# Patient Record
Sex: Female | Born: 1955 | Race: Black or African American | Hispanic: No | Smoking: Former smoker
Health system: Southern US, Community
[De-identification: ages and names within clinical notes are randomized; demographics above are authoritative.]

## PROBLEM LIST (undated history)

## (undated) DIAGNOSIS — T7840XA Allergy, unspecified, initial encounter: Secondary | ICD-10-CM

## (undated) DIAGNOSIS — D649 Anemia, unspecified: Secondary | ICD-10-CM

## (undated) DIAGNOSIS — M8580 Other specified disorders of bone density and structure, unspecified site: Secondary | ICD-10-CM

## (undated) HISTORY — DX: Other specified disorders of bone density and structure, unspecified site: M85.80

## (undated) HISTORY — PX: BREAST BIOPSY: SHX20

## (undated) HISTORY — DX: Allergy, unspecified, initial encounter: T78.40XA

## (undated) HISTORY — PX: ABDOMINAL HYSTERECTOMY: SHX81

## (undated) HISTORY — DX: Anemia, unspecified: D64.9

---

## 1999-10-06 ENCOUNTER — Emergency Department (HOSPITAL_COMMUNITY): Admission: EM | Admit: 1999-10-06 | Discharge: 1999-10-06 | Payer: Self-pay | Admitting: Emergency Medicine

## 1999-10-06 ENCOUNTER — Encounter: Payer: Self-pay | Admitting: Emergency Medicine

## 1999-10-23 ENCOUNTER — Encounter: Payer: Self-pay | Admitting: Family Medicine

## 1999-10-23 ENCOUNTER — Encounter: Admission: RE | Admit: 1999-10-23 | Discharge: 1999-10-23 | Payer: Self-pay | Admitting: Family Medicine

## 2000-01-11 ENCOUNTER — Encounter: Payer: Self-pay | Admitting: Family Medicine

## 2000-01-11 ENCOUNTER — Encounter: Admission: RE | Admit: 2000-01-11 | Discharge: 2000-01-11 | Payer: Self-pay | Admitting: Family Medicine

## 2000-06-28 ENCOUNTER — Other Ambulatory Visit: Admission: RE | Admit: 2000-06-28 | Discharge: 2000-06-28 | Payer: Self-pay | Admitting: *Deleted

## 2000-07-17 ENCOUNTER — Ambulatory Visit (HOSPITAL_COMMUNITY): Admission: RE | Admit: 2000-07-17 | Discharge: 2000-07-17 | Payer: Self-pay | Admitting: Family Medicine

## 2000-07-17 ENCOUNTER — Encounter: Payer: Self-pay | Admitting: Family Medicine

## 2000-10-29 ENCOUNTER — Other Ambulatory Visit: Admission: RE | Admit: 2000-10-29 | Discharge: 2000-10-29 | Payer: Self-pay | Admitting: Gynecology

## 2001-01-14 ENCOUNTER — Encounter: Admission: RE | Admit: 2001-01-14 | Discharge: 2001-01-14 | Payer: Self-pay | Admitting: Gynecology

## 2001-01-14 ENCOUNTER — Encounter: Payer: Self-pay | Admitting: Gynecology

## 2001-01-22 ENCOUNTER — Encounter (INDEPENDENT_AMBULATORY_CARE_PROVIDER_SITE_OTHER): Payer: Self-pay | Admitting: Specialist

## 2001-01-22 ENCOUNTER — Ambulatory Visit (HOSPITAL_COMMUNITY): Admission: RE | Admit: 2001-01-22 | Discharge: 2001-01-22 | Payer: Self-pay | Admitting: Gynecology

## 2001-05-17 ENCOUNTER — Inpatient Hospital Stay (HOSPITAL_COMMUNITY): Admission: AD | Admit: 2001-05-17 | Discharge: 2001-05-17 | Payer: Self-pay | Admitting: *Deleted

## 2001-07-28 ENCOUNTER — Inpatient Hospital Stay (HOSPITAL_COMMUNITY): Admission: RE | Admit: 2001-07-28 | Discharge: 2001-07-30 | Payer: Self-pay | Admitting: Gynecology

## 2001-07-28 ENCOUNTER — Encounter (INDEPENDENT_AMBULATORY_CARE_PROVIDER_SITE_OTHER): Payer: Self-pay | Admitting: Specialist

## 2001-09-08 ENCOUNTER — Inpatient Hospital Stay (HOSPITAL_COMMUNITY): Admission: AD | Admit: 2001-09-08 | Discharge: 2001-09-08 | Payer: Self-pay | Admitting: *Deleted

## 2002-01-16 ENCOUNTER — Encounter: Payer: Self-pay | Admitting: Gynecology

## 2002-01-16 ENCOUNTER — Encounter: Admission: RE | Admit: 2002-01-16 | Discharge: 2002-01-16 | Payer: Self-pay | Admitting: Gynecology

## 2003-03-11 ENCOUNTER — Encounter: Admission: RE | Admit: 2003-03-11 | Discharge: 2003-03-11 | Payer: Self-pay | Admitting: Gynecology

## 2003-03-11 ENCOUNTER — Encounter: Payer: Self-pay | Admitting: Gynecology

## 2004-03-23 ENCOUNTER — Encounter: Admission: RE | Admit: 2004-03-23 | Discharge: 2004-03-23 | Payer: Self-pay | Admitting: Gynecology

## 2005-03-30 ENCOUNTER — Encounter: Admission: RE | Admit: 2005-03-30 | Discharge: 2005-03-30 | Payer: Self-pay | Admitting: Internal Medicine

## 2005-04-17 ENCOUNTER — Encounter: Admission: RE | Admit: 2005-04-17 | Discharge: 2005-04-17 | Payer: Self-pay | Admitting: Internal Medicine

## 2006-04-23 ENCOUNTER — Encounter: Admission: RE | Admit: 2006-04-23 | Discharge: 2006-04-23 | Payer: Self-pay | Admitting: Internal Medicine

## 2006-11-13 ENCOUNTER — Other Ambulatory Visit: Admission: RE | Admit: 2006-11-13 | Discharge: 2006-11-13 | Payer: Self-pay | Admitting: Gynecology

## 2006-12-20 ENCOUNTER — Encounter: Admission: RE | Admit: 2006-12-20 | Discharge: 2006-12-20 | Payer: Self-pay | Admitting: Internal Medicine

## 2007-06-06 ENCOUNTER — Ambulatory Visit: Payer: Self-pay

## 2010-05-02 ENCOUNTER — Encounter: Admission: RE | Admit: 2010-05-02 | Discharge: 2010-05-02 | Payer: Self-pay | Admitting: Internal Medicine

## 2010-12-29 NOTE — Op Note (Signed)
[facility]  Patient:    Claudia Adkins, Claudia Adkins                        MRN: [id number] Proc. Date: 01/22/01 Adm. Date:  [id number] Attending:  Merrily Pew                           Operative Report  PREOPERATIVE DIAGNOSIS:       Leiomyomata.  POSTOPERATIVE DIAGNOSIS:      Leiomyomata.  PROCEDURE:                    Hysteroscopic myomectomy.  SURGEON:                      Timothy P. Fontaine, M.D.  ANESTHESIA:                   General.  ESTIMATED BLOOD LOSS:         Minimal.  COMPLICATIONS:                None.  SORBITOL DISCREPANCY:         650 cc.  Machine report noting copious amounts on the floor.  SPECIMENS:                    1. Myoma fragments.                               2. Endometrial curettings.  FINDINGS:                     EUA showed the uterus to be 12-week sized. Adnexa without gross masses.  Hysteroscopic evaluation showed two submucous myomas, one in the right lateral lower segment and one in the left posterior upper fundal region.  Hysteroscopy was otherwise normal.  The left tubal ostium was visualized.  The right tubal ostium was not visualized due to distortion from the myoma, but the periostial region was normal.  The lower uterine segment and endocervical canal were all normal.  DESCRIPTION OF PROCEDURE:     The patient was taken to the operating room and underwent general anesthesia.  She was placed in the low dorsal lithotomy position.  She received a vaginal perineal preparation with Betadine solution. The bladder was emptied with in-and-out Foley catheterization.  EUA was performed.  The patient was then draped in the usual fashion.  The cervix was visualized and grasped with a single-tooth tenaculum and gently dilated to admit the hysteroscopic resectoscope.  Hysteroscopy was then performed with findings noted above.  Both submucous myomas were removed in their entirety utilizing the right angle loop on  multiple passes, noting when the cavity was decompressed more of the myomas ballooned into the cavity, allowing for complete removal of both.  A sharp curettage was subsequently performed and, on repeat hysteroscopy, there was good distention and no evidence of perforation.  Several bleeding areas were addressed with the coagulation setting, achieving adequate hemostasis.  The instruments were then removed. The cervix was visualized.  Adequate hemostasis was visualized.  The speculum was removed and the patient placed in the supine position, awakened without difficulty and taken to the recovery room in good condition, having tolerated the procedure well.  The machine reported Sorbitol discrepancy was 650 cc, although there was a moderate amount of spillage on  the floor, making it much less than the recorded value. DD:  01/22/01 TD:  01/22/01 Job: [id number]

## 2010-12-29 NOTE — H&P (Signed)
[facility]  Patient:    Claudia Adkins, Claudia Adkins                        MRN: [id number] Adm. Date:  01/22/01 Attending:  Marcial Pacas P. Fontaine, M.D.                         History and Physical  CHIEF COMPLAINT:              Increasing dysmenorrhea and metrorrhagia.  HISTORY OF PRESENT ILLNESS:   A 55 year old G2, P2 female status post tubal sterilization who presents with a history of increasing dysmenorrhea and metrorrhagia.  The patient notes she will change tampons up to every half hour during the first two days of her cycle and her overall cycle length lasts five days.  She is otherwise having regular menses with no significant dyspareunia or other abnormalities.  Her uterus is slightly enlarged on exam, measuring roughly 10-12 weeks, and an ultrasound with sonohysterogram was performed. The sonohysterogram does confirm numerous myomas, the largest measuring 48 mm in diameter with the sonohysterogram showing an anterior submucous myoma measuring 33 mm.  The patient is admitted at this time for hysteroscopy submucous myomectomy.  PAST MEDICAL HISTORY:         Uncomplicated.  PAST SURGICAL HISTORY:        Tubal sterilization and cesarean section.  ALLERGIES:                    SULFA.  REVIEW OF SYSTEMS:            Noncontributory.  SOCIAL HISTORY:               No cigarette or alcohol use.  FAMILY HISTORY:               Noncontributory.  PHYSICAL EXAMINATION:  VITAL SIGNS:                  Afebrile.  Vital signs are stable.  HEENT:                        Normal.  LUNGS:                        Clear.  CARDIAC:                      Regular rate without rubs, murmurs, or gallops.  ABDOMEN:                      Benign.  PELVIC:                       External BUS, vagina normal.  Cervix overall normal.  Uterus 10-12 week size.  Adnexa without gross masses or tenderness.  ASSESSMENT:                   A 55 year old G2, P2 female status post  tubal sterilization with increasing dysmenorrhea and metrorrhagia, anemic on hemoglobin check with a hemoglobin of 9.3 in the office.  Sonohysterogram showing multiple myomas with a submucous component, as well as a questionable endometrial polyp.  The patient is admitted for hysteroscopic evaluation and submucous myomectomy and polypectomy if indeed is confirmed.  The expected intra-operative/postoperative courses of the procedure were discussed with the patient to include the use  of the hysteroscope, use of a resectoscope, and electrosurgical energy.  The risks of infection, prolonged antibiotics, hemorrhage necessitating transfusion, and the risks of transfusion were all discussed with her.  The risks of uterine perforation, damage to internal organs including bowel, bladder, ureters, vessels, and nerves necessitating major exploratory reparative surgeries and future reparative surgeries including ostomy formation was all discussed with her.  She understands that is she does have multiple myomas that we will not address all of her leiomyomata but only the submucous components.  Hopefully, that removing this will lighten her periods, although she understands there are no guarantees and that her periods may continue to be persistently heavy and painful in the future.  She agrees with this conservative approach, although understands that if indeed her periods continue to be heavy and unacceptably painful that more aggressive therapies may be offered in the future.  The patients questions were all answered to her satisfaction and she is ready to proceed with surgery. DD:  01/20/01 TD:  01/20/01 Job: [id number]

## 2010-12-29 NOTE — Discharge Summary (Signed)
[facility]  Patient:    Claudia Adkins, Claudia Adkins Visit Number: [id number] MRN: [id number]          Service Type: GYN Location: 9300 [id number] Attending Physician:  Merrily Pew Dictated by:   Antony Contras, N.P. Admit Date:  07/28/2001 Discharge Date: 07/30/2001                             Discharge Summary  DISCHARGE DIAGNOSES:          Leiomyomata, suspected endometriosis.  PROCEDURE:                    Total abdominal hysterectomy.  HISTORY OF PRESENT ILLNESS:   Patient is a 55 year old gravida 2, para 2 status post tubal sterilization with a history of increasing dysmenorrhea and menorrhagia.  She has known leiomyomata.  Underwent a hysteroscopic myomectomy in June 2000.  Her symptoms have persisted and she is admitted now for a TAH.  HOSPITAL COURSE:              Patient was admitted on July 28, 2001. Total abdominal hysterectomy was performed by Dr. Colin Broach, assisted by Dr. Douglass Rivers under general endotracheal anesthesia.  Findings included large uterus with multiple leiomyomata, obliterated cul-de-sac to the level of the lower uterine segment posteriorly.  No active endometriosis seen.  Right and left ovaries grossly normal.  Left ovary adherent to the left ______ uterus which was free.  Fallopian tubes segments bilaterally within normal limits.  Anterior cul-de-sac with evidence of prior cesarean section, scarring of midline uterine anterior surface.  Postoperatively patient remained afebrile.  She did have some orthostasis postoperatively, but did have a hemoglobin of 8.9 with hematocrit 26.3, platelets 173,000.  She was able to be discharged on her second postoperative day in satisfactory condition.  DISPOSITION:                  Follow up in the office in two weeks.  She was given 20 of Tylox. Dictated by:   Antony Contras, N.P. Attending Physician:  Merrily Pew DD:  08/15/01 TD:  08/16/01 Job:  [id number]

## 2010-12-29 NOTE — H&P (Signed)
[facility]  Patient:    Claudia Adkins, Claudia Adkins Visit Number: [id number] MRN: [id number]          Service Type: GYN Location: [facility] Attending Physician:  Wetzel Bjornstad Dictated by:   Nadyne Coombes Fontaine, M.D. Admit Date:  05/17/2001 Discharge Date: 05/17/2001                           History and Physical  CHIEF COMPLAINT:              Increased dysmenorrhea, menorrhagia, leiomyomata.  HISTORY OF PRESENT ILLNESS:   A 55 year old G2 P2 female status post tubal sterilization with a history of increasing dysmenorrhea and menorrhagia.  The patient has known leiomyomata and underwent a hysteroscopic myomectomy in June 2002 to remove intercavitary myomas.  The patients symptoms have persisted and she is admitted at this time for TAH.  PAST MEDICAL HISTORY:         Uncomplicated.  PAST SURGICAL HISTORY:        Tubal sterilization, cesarean section x 2.  ALLERGIES:                    SULFA.  REVIEW OF SYSTEMS:            Noncontributory.  SOCIAL HISTORY:               No cigarette or alcohol use.  FAMILY HISTORY:               Noncontributory.  PHYSICAL EXAMINATION:  VITAL SIGNS:                  Afebrile; vital signs are stable.  HEENT:                        Normal.  LUNGS:                        Clear.  CARDIAC:                      Regular rate without rubs, murmurs, or gallops.  ABDOMEN:                      Benign.  PELVIC:                       External, BUS, vagina normal.  Cervix grossly normal.  Uterus bulky, relatively fixed.  Adnexa without masses or tenderness.  ASSESSMENT:                   A 55 year old G2 P2 female status post tubal sterilization, approximately 14-week size uterus with multiple myomas, increasing menorrhagia, dysmenorrhea, for total abdominal hysterectomy. Alternatives for the procedure have been reviewed with the patient and she elects for a total abdominal hysterectomy.  The patients history is significant  for anemia due to her bleeding, noting a hemoglobin of 9.0 in June.  She subsequently has been on Lupron Depot for menstrual suppression and her most recent hemoglobin is 11.8.  I reviewed with the patient the expected intraoperative/postoperative courses and her recovery period.  We initially evaluated for the potential for an LAVH but due to the relative fixed nature and bulkiness of the uterus, we felt it most prudent to proceed with a total abdominal hysterectomy.  The ovarian conservation issue was reviewed with her and the  options for removing her ovaries or keeping them were reviewed, and the issues of continued hormonal production versus the risks of ovarian cancer and the problems with benign ovarian disease in the future requiring reoperation was all reviewed with her.  The patient desires to keep one or both ovaries and she accepts the risks of ovarian cancer and reoperation for benign ovarian disease, although she does give me permission to remove one or both ovaries if significant disease is encountered at the time of surgery. The risks of inadvertent injury to internal organs during the procedure, including bowel, bladder, ureters, vessels, and nerves necessitating major exploratory reparative surgeries and future reparative surgeries including ostomy formation were discussed with her.  The risks of transfusion, particularly noting her history of a low hemoglobin, were reviewed, and the risks of transfusion reaction, hepatitis, HIV, mad cow disease, and other unknown entities were all reviewed, understood, and accepted.  The risks of infection both internal requiring prolonged antibiotics, abscess formation requiring opening and draining of abscesses, as well as wound complications requiring opening and draining of incisions and closure by secondary intention was all discussed with her.  The absolute sterility associated with hysterectomy was reviewed with her.  The sexuality  following hysterectomy was also reviewed and the potential for orgasmic dysfunction as well as persistent dyspareunia was all discussed, understood, and accepted.  The patients questions were answered to her satisfaction and she is ready to proceed with surgery. Dictated by:   Nadyne Coombes. Fontaine, M.D. Attending Physician:  Wetzel Bjornstad DD:  07/25/01 TD:  07/25/01 Job: [id number]

## 2010-12-29 NOTE — Op Note (Signed)
[facility]  Patient:    Claudia Adkins, Claudia Adkins Visit Number: [id number] MRN: [id number]          Service Type: GYN Location: 9300 [id number] Attending Physician:  Merrily Pew Dictated by:   Nadyne Coombes Fontaine, M.D. Proc. Date: 07/28/01 Admit Date:  07/28/2001                             Operative Report  PREOPERATIVE DIAGNOSIS:       Leiomyomata.  POSTOPERATIVE DIAGNOSIS:      Leiomyomata, endometriosis suspected.  OPERATION:                    Total abdominal hysterectomy.  SURGEON:                      Timothy P. Fontaine, M.D.  ASSISTANT:                    Douglass Rivers, M.D.  ANESTHESIA:                   General endotracheal anesthesia.  ESTIMATED BLOOD LOSS:         750 cc.  COMPLICATIONS:                None.  SPECIMEN:                     Uterus.  FINDINGS:  Enlarged uterus with multiple leiomyomata, obliterated cul-de-sac to the level of the lower uterine segment posteriorly.  No active endometriosis seen.  Right and left ovaries grossly normal.  Left ovary initially adherent to the left lateral uterus which was free.  Fallopian tube segments bilaterally within normal limits.  Anterior cul-de-sac with evidence of prior cesarean section.  Scarring midline uterine anterior surface.  DESCRIPTION OF PROCEDURE:     The patient was taken to the operating room and placed in the supine position, underwent general endotracheal anesthesia without difficulty, received an abdominal, vaginal, perineal preparation with Betadine solution.  Bladder emptied with an indwelling Foley catheter placed under sterile technique.  The patient was draped in the usual fashion and the abdomen was sharply entered through a repeat Pfannenstiel incision achieving adequate hemostasis at all levels.  The Balfour retractor was placed within the incision as well as the bladder blade and the intestines were packed from the operative field.  Examination of the  uterus revealed posterior cul-de-sac obliteration and the hysterectomy was initiated through identification and electrocautery transection of both the right and the left round ligaments. The right utero-ovarian pedicle was then isolated, doubly clamped, cut and doubly ligated using 0 Vicryl suture.  The left ovary was then freed from its adhesive attachments to the uterus and subsequently the left utero-ovarian pedicle was identified, doubly clamped, cut and doubly ligated with 0 Vicryl suture.  The bladder was noted to be adherent high on the anterior uterine surface and through progressive sharp and blunt dissection, the anterior peritoneal reflection was sharply incised and the bladder flap developed.  Due to the relative immobility of the uterus due to the cul-de-sac scarring and the enlargement due to the leiomyomata, a supracervical hysterectomy was initially performed to improve visualization.   The uterus was injected using Pitressin 10 units in 20 cc and the supracervical portion of the uterus was then excised.  During this procedure, both right and left uterine vessels were skeletonized, clamped,  cut and ligated using 0 Vicryl.  After excision of the uterine fundus, the posterior cul-de-sac plane was further developed through sharp and blunt dissection, noting the sigmoid rectum appeared intact and the lower uterine segment cervix was progressively free of its attachments through clamping, cutting and ligating using 0 Vicryl suture of the paracervical tissues, cardinal ligaments and the vagina was crossclamped bilaterally and the cervix was excised separately.  Right and left angle sutures were then placed using 0 Vicryl suture and tagged for future reference and the intervening vaginal mucosa was reapproximated anterior to posterior using 0 Vicryl suture in interrupted figure-of-eight stitch.  The pelvis was copiously irrigated.  Adequate hemostasis achieved with electrocautery  and 3-0 interrupted suture for small bladder flap oozing areas and subsequently both ovaries were reinspected and noted to be grossly normal and left in situ.  The bowel packing was removed.  Balfour retractor and bladder blade removed, and the anterior fascia was then reapproximated using 0 Vicryl suture in a running stitch starting at the angle, meeting in the middle.  Subcutaneous tissues were irrigated and subsequently the skin was reapproximated using staples.  A sterile dressing was applied.  The patient was awakened without difficulty and taken to the recovery room in good condition, having tolerated the procedure well. Dictated by:   Nadyne Coombes. Fontaine, M.D. Attending Physician:  Merrily Pew DD:  07/28/01 TD:  07/28/01 Job: [phone]

## 2011-03-26 ENCOUNTER — Other Ambulatory Visit: Payer: Self-pay | Admitting: Internal Medicine

## 2011-03-26 DIAGNOSIS — Z1231 Encounter for screening mammogram for malignant neoplasm of breast: Secondary | ICD-10-CM

## 2011-04-18 ENCOUNTER — Inpatient Hospital Stay (HOSPITAL_COMMUNITY): Admission: RE | Admit: 2011-04-18 | Payer: PRIVATE HEALTH INSURANCE | Source: Ambulatory Visit

## 2011-05-09 ENCOUNTER — Ambulatory Visit
Admission: RE | Admit: 2011-05-09 | Discharge: 2011-05-09 | Disposition: A | Discharge status: Home / Self Care | Payer: PRIVATE HEALTH INSURANCE | Source: Ambulatory Visit | Attending: Internal Medicine | Admitting: Internal Medicine

## 2011-05-09 DIAGNOSIS — Z1231 Encounter for screening mammogram for malignant neoplasm of breast: Secondary | ICD-10-CM

## 2012-04-16 ENCOUNTER — Other Ambulatory Visit: Payer: Self-pay | Admitting: Internal Medicine

## 2012-04-16 DIAGNOSIS — Z1231 Encounter for screening mammogram for malignant neoplasm of breast: Secondary | ICD-10-CM

## 2012-05-12 ENCOUNTER — Ambulatory Visit
Admission: RE | Admit: 2012-05-12 | Discharge: 2012-05-12 | Disposition: A | Discharge status: Home / Self Care | Payer: Self-pay | Source: Ambulatory Visit | Attending: Internal Medicine | Admitting: Internal Medicine

## 2012-05-12 DIAGNOSIS — Z1231 Encounter for screening mammogram for malignant neoplasm of breast: Secondary | ICD-10-CM

## 2012-08-01 LAB — HM COLONOSCOPY

## 2013-05-12 ENCOUNTER — Other Ambulatory Visit: Payer: Self-pay

## 2013-05-12 DIAGNOSIS — Z1231 Encounter for screening mammogram for malignant neoplasm of breast: Secondary | ICD-10-CM

## 2013-05-19 ENCOUNTER — Ambulatory Visit
Admission: RE | Admit: 2013-05-19 | Discharge: 2013-05-19 | Disposition: A | Discharge status: Home / Self Care | Payer: PRIVATE HEALTH INSURANCE | Source: Ambulatory Visit

## 2013-05-19 DIAGNOSIS — Z1231 Encounter for screening mammogram for malignant neoplasm of breast: Secondary | ICD-10-CM

## 2013-09-30 ENCOUNTER — Ambulatory Visit (INDEPENDENT_AMBULATORY_CARE_PROVIDER_SITE_OTHER): Payer: PRIVATE HEALTH INSURANCE | Admitting: Physician Assistant

## 2013-09-30 VITALS — BP 144/90 | HR 72 | Temp 97.6°F | Resp 16 | Ht 69.0 in | Wt 180.8 lb

## 2013-09-30 DIAGNOSIS — J329 Chronic sinusitis, unspecified: Secondary | ICD-10-CM

## 2013-09-30 DIAGNOSIS — J3489 Other specified disorders of nose and nasal sinuses: Secondary | ICD-10-CM

## 2013-09-30 DIAGNOSIS — H6980 Other specified disorders of Eustachian tube, unspecified ear: Secondary | ICD-10-CM

## 2013-09-30 DIAGNOSIS — H6990 Unspecified Eustachian tube disorder, unspecified ear: Secondary | ICD-10-CM

## 2013-09-30 DIAGNOSIS — R0981 Nasal congestion: Secondary | ICD-10-CM

## 2013-09-30 MED ORDER — AMOXICILLIN-POT CLAVULANATE 875-125 MG PO TABS: 1.0000 | ORAL_TABLET | Freq: Two times a day (BID) | ORAL | Status: DC

## 2013-09-30 MED ORDER — IPRATROPIUM BROMIDE 0.03 % NA SOLN: 2.0000 | Freq: Two times a day (BID) | NASAL | Status: DC

## 2013-09-30 MED ORDER — GUAIFENESIN ER 1200 MG PO TB12: 1.0000 | ORAL_TABLET | Freq: Two times a day (BID) | ORAL | Status: DC | PRN

## 2013-09-30 NOTE — Addendum Note (Signed)
Addended by: Collene Leyden on: 09/30/2013 03:36 PM   Modules accepted: Level of Service

## 2013-09-30 NOTE — Progress Notes (Signed)
   Subjective:    Patient ID: Claudia Adkins, female    DOB: 1955/11/21, 58 y.o.   MRN: [id number]  HPI 58 year old female presents for evaluation of 1 week history of left sided sinus pain/pressure, ear pain, headache, PND, and thin, clear rhinorrhea.  Denies SOB, wheezing, chest pain, sore throat, dizziness, nausea, vomiting, or abdominal pain.  Has not used any OTC medications.  Hx of sinus infections with last episode "years" ago.   Patient is otherwise healthy with no other concerns today  Works at [employer] in [place].     Review of Systems  Constitutional: Negative for fever and chills.  HENT: Positive for congestion, ear pain (right side), postnasal drip, rhinorrhea and sinus pressure (right). Negative for sore throat.   Respiratory: Negative for cough, shortness of breath and wheezing.   Cardiovascular: Negative for chest pain.  Gastrointestinal: Negative for nausea, vomiting and abdominal pain.  Neurological: Positive for headaches. Negative for dizziness.       Objective:   Physical Exam  Constitutional: She is oriented to person, place, and time. She appears well-developed and well-nourished.  HENT:  Head: Normocephalic and atraumatic.  Right Ear: Hearing, external ear and ear canal normal. A middle ear effusion is present.  Left Ear: Hearing, tympanic membrane, external ear and ear canal normal.  Mouth/Throat: Uvula is midline, oropharynx is clear and moist and mucous membranes are normal.  Eyes: Conjunctivae are normal.  Neck: Normal range of motion. Neck supple.  Cardiovascular: Normal rate, regular rhythm and normal heart sounds.   Pulmonary/Chest: Effort normal and breath sounds normal.  Lymphadenopathy:    She has no cervical adenopathy.  Neurological: She is alert and oriented to person, place, and time.  Psychiatric: She has a normal mood and affect. Her behavior is normal. Thought content normal.          Assessment & Plan:  Sinus infection  - Plan: amoxicillin-clavulanate (AUGMENTIN) 875-125 MG per tablet  ETD (eustachian tube dysfunction) - Plan: ipratropium (ATROVENT) 0.03 % nasal spray  Nasal congestion - Plan: Guaifenesin (MUCINEX MAXIMUM STRENGTH) 1200 MG TB12  Will treat with Augmentin 875 mg bid x 10 days Atrovent NS twice daily to help with ETD and sinus pressure Mucinex twice daily as directed Increase fluids and rest Follow up if symptoms worsen or fail to improve.

## 2014-05-20 ENCOUNTER — Other Ambulatory Visit: Payer: Self-pay

## 2014-05-20 DIAGNOSIS — Z1231 Encounter for screening mammogram for malignant neoplasm of breast: Secondary | ICD-10-CM

## 2014-06-08 ENCOUNTER — Encounter (INDEPENDENT_AMBULATORY_CARE_PROVIDER_SITE_OTHER): Payer: Self-pay

## 2014-06-08 ENCOUNTER — Ambulatory Visit
Admission: RE | Admit: 2014-06-08 | Discharge: 2014-06-08 | Disposition: A | Discharge status: Home / Self Care | Payer: 59 | Source: Ambulatory Visit

## 2014-06-08 DIAGNOSIS — Z1231 Encounter for screening mammogram for malignant neoplasm of breast: Secondary | ICD-10-CM

## 2015-02-13 ENCOUNTER — Ambulatory Visit (INDEPENDENT_AMBULATORY_CARE_PROVIDER_SITE_OTHER): Payer: BLUE CROSS/BLUE SHIELD | Admitting: Emergency Medicine

## 2015-02-13 VITALS — BP 138/78 | HR 62 | Temp 99.2°F | Resp 16 | Ht 69.0 in | Wt 184.0 lb

## 2015-02-13 DIAGNOSIS — K047 Periapical abscess without sinus: Secondary | ICD-10-CM

## 2015-02-13 MED ORDER — HYDROCODONE-ACETAMINOPHEN 5-325 MG PO TABS: 1.0000 | ORAL_TABLET | Freq: Four times a day (QID) | ORAL | Status: DC | PRN

## 2015-02-13 MED ORDER — AMOXICILLIN 875 MG PO TABS: 875.0000 mg | ORAL_TABLET | Freq: Two times a day (BID) | ORAL | Status: DC

## 2015-02-13 NOTE — Patient Instructions (Signed)

## 2015-02-13 NOTE — Progress Notes (Signed)
   Subjective:  This chart was scribed for Arlyss Queen, MD by Thea Alken, ED Scribe. This patient was seen in room 1 and the patient's care was started at 11:14 AM.  Patient ID: Claudia Adkins, female    DOB: [birth year], 59 y.o.   MRN: [id number]  HPI   Chief Complaint  Patient presents with  . Dental Pain    x2days/ has swelling/ jaw   HPI Comments: Claudia Adkins is a 59 y.o. female who presents to the [facility] complaining of throbbing right, lower dental pain that began 2 days ago. Pt reports she woke up this morning with worsening dental pain along with clots in her mouth. She reports associated right facial tenderness and swelling. Pt has taken aleve and tried oral gel. Pt has not been in contact with her dentist but plans to make an appointment in 2 days. Pt is allergic to sulfa antibiotics. Pt denies other health problems.   Past Medical History  Diagnosis Date  . Allergy   . Anemia    Past Surgical History  Procedure Laterality Date  . Cesarean section    . Abdominal hysterectomy     Prior to Admission medications   Not on File   Review of Systems  HENT: Positive for dental problem and facial swelling.    Objective:   Physical Exam  CONSTITUTIONAL: Well developed/well nourished HEAD: Normocephalic/atraumatic EYES: EOMI/PERRL ENMT: Mucous membranes moist. The right first molar and 90 lower jaw is absent the second molar has inflammation and purulent drainage around the base of the tooth as well as tenderness with tapping of the tooth. The wisdom tooth appears normal.  NECK: supple no meningeal signs there is swelling around the right side of the jaw SPINE/BACK:entire spine nontender CV: S1/S2 noted, no murmurs/rubs/gallops noted LUNGS: Lungs are clear to auscultation bilaterally, no apparent distress ABDOMEN: soft, nontender, no rebound or guarding, bowel sounds noted throughout abdomen GU:no cva tenderness NEURO: Pt is  awake/alert/appropriate, moves all extremitiesx4.  No facial droop.   EXTREMITIES: pulses normal/equal, full ROM SKIN: warm, color normal PSYCH: no abnormalities of mood noted, alert and oriented to situation  Filed Vitals:   02/13/15 1106  BP: 138/78  Pulse: 62  Temp: 99.2 F (37.3 C)  Resp: 16  Height: 5\' 9"  (1.753 m)  Weight: 184 lb (83.462 kg)  SpO2: 98%   Assessment & Plan:  Patient has a dental abscess right second molar. Will treat with amoxicillin Aleve 2 twice a day hydrocodone for pain saltwater rinses follow-up with dentist on Tuesday the next working day.I personally performed the services described in this documentation, which was scribed in my presence. The recorded information has been reviewed and is accurate.  Nena Jordan, MD

## 2015-05-03 ENCOUNTER — Other Ambulatory Visit: Payer: Self-pay

## 2015-05-03 DIAGNOSIS — Z1231 Encounter for screening mammogram for malignant neoplasm of breast: Secondary | ICD-10-CM

## 2015-06-13 ENCOUNTER — Ambulatory Visit
Admission: RE | Admit: 2015-06-13 | Discharge: 2015-06-13 | Disposition: A | Discharge status: Home / Self Care | Payer: BLUE CROSS/BLUE SHIELD | Source: Ambulatory Visit

## 2015-06-13 DIAGNOSIS — Z1231 Encounter for screening mammogram for malignant neoplasm of breast: Secondary | ICD-10-CM

## 2016-11-12 ENCOUNTER — Emergency Department (HOSPITAL_COMMUNITY)
Admission: EM | Admit: 2016-11-12 | Discharge: 2016-11-13 | Disposition: A | Discharge status: Home / Self Care | Payer: BLUE CROSS/BLUE SHIELD | Attending: Emergency Medicine | Admitting: Emergency Medicine

## 2016-11-12 ENCOUNTER — Emergency Department (HOSPITAL_COMMUNITY): Payer: BLUE CROSS/BLUE SHIELD

## 2016-11-12 ENCOUNTER — Encounter (HOSPITAL_COMMUNITY): Payer: Self-pay | Admitting: *Deleted

## 2016-11-12 DIAGNOSIS — M79601 Pain in right arm: Secondary | ICD-10-CM | POA: Insufficient documentation

## 2016-11-12 DIAGNOSIS — M5412 Radiculopathy, cervical region: Secondary | ICD-10-CM | POA: Insufficient documentation

## 2016-11-12 DIAGNOSIS — R0789 Other chest pain: Secondary | ICD-10-CM | POA: Insufficient documentation

## 2016-11-12 LAB — CBC
HEMATOCRIT: 36.9 % (ref 36.0–46.0)
HEMOGLOBIN: 12.5 g/dL (ref 12.0–15.0)
MCH: 29.6 pg (ref 26.0–34.0)
MCHC: 33.9 g/dL (ref 30.0–36.0)
MCV: 87.4 fL (ref 78.0–100.0)
Platelets: 213 10*3/uL (ref 150–400)
RBC: 4.22 MIL/uL (ref 3.87–5.11)
RDW: 13.2 % (ref 11.5–15.5)
WBC: 5.9 10*3/uL (ref 4.0–10.5)

## 2016-11-12 LAB — BASIC METABOLIC PANEL
ANION GAP: 8 (ref 5–15)
BUN: 12 mg/dL (ref 6–20)
CALCIUM: 9.1 mg/dL (ref 8.9–10.3)
CO2: 26 mmol/L (ref 22–32)
Chloride: 108 mmol/L (ref 101–111)
Creatinine, Ser: 0.92 mg/dL (ref 0.44–1.00)
GFR calc Af Amer: >60 mL/min (ref >60)
GFR calc non Af Amer: >60 mL/min (ref >60)
GLUCOSE: 94 mg/dL (ref 65–99)
Potassium: 3.7 mmol/L (ref 3.5–5.1)
Sodium: 142 mmol/L (ref 135–145)

## 2016-11-12 LAB — I-STAT TROPONIN, ED: TROPONIN I, POC: 0 ng/mL (ref 0.00–0.08)

## 2016-11-12 MED ORDER — IBUPROFEN 400 MG PO TABS
400.0000 mg | ORAL_TABLET | Freq: Once | ORAL | Status: AC
  Administered 2016-11-12: 400 mg via ORAL

## 2016-11-12 MED ORDER — IBUPROFEN 400 MG PO TABS
ORAL_TABLET | ORAL | Status: AC
  Filled 2016-11-12: qty 1

## 2016-11-12 NOTE — ED Provider Notes (Signed)
[facility] DEPT Provider Note   CSN: [id number] Arrival date & time: 11/12/16  1649     History   Chief Complaint Chief Complaint  Patient presents with  . Chest Pain  . Arm Pain    HPI Claudia Adkins is a 61 y.o. female.  HPI Claudia Adkins is a 61 y.o. female with history of anemia, presents to emergency department complaining of right-sided chest pain, right arm pain, right neck pain, numbness and tingling in the right forearm. Patient states her symptoms started 2 weeks ago, started with the pain to the right neck and right upper chest. She states she feels cramping in her right triceps muscle. She states that today she developed tingling in the right forearm and hand which is why she came to the ED. Denies history of any heart problems. Denies history of diabetes or hypertension. No family history of heart disease. She is an ex-smoker. Denies any neck injuries. No midline cervical spine pain. Denies any weakness in the right hand. Denies any pleuritic symptoms. Denies associated shortness of breath, dizziness, lightheadedness, apheresis, changes of vision. No difficulty speaking, walking.  Past Medical History:  Diagnosis Date  . Allergy   . Anemia     There are no active problems to display for this patient.   Past Surgical History:  Procedure Laterality Date  . ABDOMINAL HYSTERECTOMY    . CESAREAN SECTION      OB History    No data available       Home Medications    Prior to Admission medications   Medication Sig Start Date End Date Taking? Authorizing Provider  amoxicillin (AMOXIL) 875 MG tablet Take 1 tablet (875 mg total) by mouth 2 (two) times daily. 02/13/15   Darlyne Russian, MD  HYDROcodone-acetaminophen (NORCO) 5-325 MG per tablet Take 1 tablet by mouth every 6 (six) hours as needed. 02/13/15   Darlyne Russian, MD    Family History Family History  Problem Relation Age of Onset  . Hyperlipidemia Mother   . Hypertension Mother   . Mental illness  Mother     Social History Social History  Substance Use Topics  . Smoking status: Never Smoker  . Smokeless tobacco: Never Used  . Alcohol use Yes     Comment: occ     Allergies   Sulfa antibiotics   Review of Systems Review of Systems  Constitutional: Negative for chills and fever.  Respiratory: Positive for chest tightness. Negative for cough and shortness of breath.   Cardiovascular: Positive for chest pain. Negative for palpitations and leg swelling.  Gastrointestinal: Negative for abdominal pain, diarrhea, nausea and vomiting.  Genitourinary: Negative for dysuria, flank pain and pelvic pain.  Musculoskeletal: Positive for arthralgias and myalgias. Negative for neck pain and neck stiffness.  Skin: Negative for rash.  Neurological: Positive for numbness. Negative for dizziness, weakness and headaches.  All other systems reviewed and are negative.    Physical Exam Updated Vital Signs BP 132/74 (BP Location: Left Arm)   Pulse 67   Temp 97.9 F (36.6 C) (Oral)   Resp 19   Ht 5\' 10"  (1.778 m)   Wt 81.6 kg   SpO2 99%   BMI 25.83 kg/m   Physical Exam  Constitutional: She is oriented to person, place, and time. She appears well-developed and well-nourished. No distress.  HENT:  Head: Normocephalic.  Eyes: Conjunctivae are normal.  Neck: Normal range of motion. Neck supple.  No midline cervical spine tenderness. Mild ttp  over right sternocleidomastoid. No pulsatile masses in the neck.   Cardiovascular: Normal rate, regular rhythm and normal heart sounds.   Pulmonary/Chest: Effort normal and breath sounds normal. No respiratory distress. She has no wheezes. She has no rales.  Abdominal: Soft. Bowel sounds are normal. She exhibits no distension. There is no tenderness. There is no rebound.  Musculoskeletal: She exhibits no edema.  ttp over right tricep. Full rom of right arm.   Neurological: She is alert and oriented to person, place, and time.  Normal strength of  deltoid, tricep, bicep, hand grip, wrist flexion and extension. Distal radial pulses intact and equal bialterally. Cap refill <2 sec.   Skin: Skin is warm and dry.  Psychiatric: She has a normal mood and affect. Her behavior is normal.  Nursing note and vitals reviewed.    ED Treatments / Results  Labs (all labs ordered are listed, but only abnormal results are displayed) Labs Reviewed  D-DIMER, QUANTITATIVE (NOT AT [facility]) - Abnormal; Notable for the following:       Result Value   D-Dimer, Quant 0.52 (*)    All other components within normal limits  BASIC METABOLIC PANEL  CBC  I-STAT TROPOININ, ED  I-STAT TROPOININ, ED    EKG  EKG Interpretation None       Radiology Dg Chest 2 View  Result Date: 11/12/2016 CLINICAL DATA:  Initial evaluation for acute superior right-sided chest pain with right arm tingling. EXAM: CHEST  2 VIEW COMPARISON:  None. FINDINGS: The cardiac and mediastinal silhouettes are within normal limits. The lungs are normally inflated. No airspace consolidation, pleural effusion, or pulmonary edema is identified. There is no pneumothorax. No acute osseous abnormality identified. IMPRESSION: No active cardiopulmonary disease. Electronically Signed   By: Jeannine Boga M.D.   On: 11/12/2016 18:08    Procedures Procedures (including critical care time)  Medications Ordered in ED Medications  ibuprofen (ADVIL,MOTRIN) 400 MG tablet (not administered)  ibuprofen (ADVIL,MOTRIN) tablet 400 mg (400 mg Oral Given 11/12/16 1707)     Initial Impression / Assessment and Plan / ED Course  I have reviewed the triage vital signs and the nursing notes.  Pertinent labs & imaging results that were available during my care of the patient were reviewed by me and considered in my medical decision making (see chart for details).    Pt seen and examined. Pt with atypical right sided chest pain, right arm pain, tingling in right hand for 2 weeks. She is neurovascularly  intact on exam. NAD. Labs laready obtained in triage, negative trop, labs, CXR. Discussed with Dr. Leonides Schanz. Will add a d dimer and delta trop. Pt has no risk factors for CAD, other than being an ex smoker. Question radiculopathy? Will monitor.   1:01 AM Repeat troponin and d dimer negative. D dimer is 0.52, age ajusted is within normal. At this point, doubt Doubt dissection. PE. Her symptoms are very atypical for ACS, and now have two negative troponins and ecg. Symptoms most consistent with radicular pain, will try a course of steroids and follow up with pcp. Discussed results and plan with pt, agrees.   Vitals:   11/12/16 2330 11/12/16 2345 11/13/16 0045 11/13/16 0130  BP: (!) 156/86 (!) 159/93 (!) 147/86 (!) 144/86  Pulse: 60 65 60 (!) 56  Resp: 20 (!) 21 14 14   Temp:      TempSrc:      SpO2: 99% 100% 100% 99%  Weight:      Height:  Final Clinical Impressions(s) / ED Diagnoses   Final diagnoses:  Right arm pain  Atypical chest pain  Cervical radiculopathy    New Prescriptions Discharge Medication List as of 11/13/2016  1:19 AM    START taking these medications   Details  predniSONE (DELTASONE) 20 MG tablet Take 2 tablets (40 mg total) by mouth daily., Starting Tue 11/13/2016, Print         Jeannett Senior, PA-C 11/13/16 [place], MD 11/13/16 [phone]

## 2016-11-12 NOTE — ED Notes (Signed)
Pt approached nurse first desk asking about wait times and that she has seen people who arrived after her go back before her and she is here for CP; RN explained the triage process and fast track area to aid in getting minor complaints out quickly; pt returned to her seat with steady gait

## 2016-11-12 NOTE — ED Triage Notes (Addendum)
Pt states R aching chest pain x 2 weeks.  Her R arm began cramping and tingling today, so she came in.  Denies nausea, sob or dizziness.  Pain somewhat relieved by ibuprofen.

## 2016-11-13 LAB — D-DIMER, QUANTITATIVE: D-Dimer, Quant: 0.52 ug/mL-FEU — ABNORMAL HIGH (ref 0.00–0.50)

## 2016-11-13 LAB — I-STAT TROPONIN, ED: Troponin i, poc: 0 ng/mL (ref 0.00–0.08)

## 2016-11-13 MED ORDER — PREDNISONE 20 MG PO TABS: 40.0000 mg | ORAL_TABLET | Freq: Every day | ORAL | 0 refills | Status: DC

## 2016-11-13 NOTE — Discharge Instructions (Signed)
ER workup today did not show any significant abnormalities in your blood work or x-ray. Take Tylenol for pain. Take prednisone as prescribed until all gone for inflammation. Please follow-up with family doctor if continued to have symptoms. Return if any weakness in your hand, worsening chest pain, worsening shortness of breath, or any new concerning symptoms.

## 2016-12-11 ENCOUNTER — Ambulatory Visit: Payer: BLUE CROSS/BLUE SHIELD | Admitting: Nurse Practitioner

## 2017-05-23 ENCOUNTER — Other Ambulatory Visit: Payer: Self-pay | Admitting: Internal Medicine

## 2017-05-23 ENCOUNTER — Other Ambulatory Visit: Payer: Self-pay

## 2017-05-23 DIAGNOSIS — R5381 Other malaise: Secondary | ICD-10-CM

## 2017-05-24 ENCOUNTER — Ambulatory Visit (INDEPENDENT_AMBULATORY_CARE_PROVIDER_SITE_OTHER): Payer: PRIVATE HEALTH INSURANCE | Admitting: Physician Assistant

## 2017-05-24 ENCOUNTER — Encounter: Payer: Self-pay | Admitting: Physician Assistant

## 2017-05-24 VITALS — BP 140/82 | HR 65 | Temp 98.1°F | Ht 68.25 in | Wt 184.0 lb

## 2017-05-24 DIAGNOSIS — Z1159 Encounter for screening for other viral diseases: Secondary | ICD-10-CM

## 2017-05-24 DIAGNOSIS — Z0184 Encounter for antibody response examination: Secondary | ICD-10-CM | POA: Diagnosis not present

## 2017-05-24 DIAGNOSIS — D649 Anemia, unspecified: Secondary | ICD-10-CM

## 2017-05-24 DIAGNOSIS — Z13220 Encounter for screening for lipoid disorders: Secondary | ICD-10-CM | POA: Diagnosis not present

## 2017-05-24 DIAGNOSIS — Z114 Encounter for screening for human immunodeficiency virus [HIV]: Secondary | ICD-10-CM

## 2017-05-24 DIAGNOSIS — Z23 Encounter for immunization: Secondary | ICD-10-CM | POA: Diagnosis not present

## 2017-05-24 DIAGNOSIS — Z0000 Encounter for general adult medical examination without abnormal findings: Secondary | ICD-10-CM | POA: Diagnosis not present

## 2017-05-24 DIAGNOSIS — Z111 Encounter for screening for respiratory tuberculosis: Secondary | ICD-10-CM | POA: Diagnosis not present

## 2017-05-24 DIAGNOSIS — Z136 Encounter for screening for cardiovascular disorders: Secondary | ICD-10-CM | POA: Diagnosis not present

## 2017-05-24 NOTE — Progress Notes (Signed)
Claudia Adkins is a 61 y.o. female here to Establish Care.  I acted as a Education administrator for [employer], PA-C Anselmo Pickler, LPN  History of Present Illness:   Chief Complaint  Patient presents with  . Establish Care    Generic Commercial    Acute Concerns: None  Chronic Issues: Anemia -- history of this; currently on MVI, unsure if it has iron in it. Denies dizziness, lightheadedness, excessive fatigue. Does not get menses, s/p hysterectomy.  Health Maintenance: Immunizations -- influenza due, getting today Colonoscopy -- Dr. Collene Mares in [place], unknown date, requesting records Mammogram -- last done in 2016, due today -- will offer a list of places to choose from PAP -- s/p hysterectomy, no prior abnormal results Bone Density -- was told she had the beginning stages of osteopenia -- has had two scans in the past (mom has hx of osteoporosis) Diet -- eats all food groups --> eats more foods; eats lots of salads Caffeine intake -- 3-4 cups of coffee Sleep habits -- now 8-9, is usually 3-4 hours because she works 3rd shift Exercise -- every day she gets at least 30 min in Weight -- Weight: 184 lb (83.5 kg) --> normal for her; up to 190 lb in the summertime at one point Mood -- no mood disorders  Has never had issues with blood pressure  Depression screen PHQ 2/9 05/24/2017  Decreased Interest 0  Down, Depressed, Hopeless 0  PHQ - 2 Score 0    No flowsheet data found.   Past Medical History:  Diagnosis Date  . Allergy   . Anemia      Social History   Social History  . Marital status: Married    Spouse name: N/A  . Number of children: N/A  . Years of education: N/A   Occupational History  . Not on file.   Social History Main Topics  . Smoking status: Former Smoker    Packs/day: 0.50    Years: 10.00    Types: Cigarettes  . Smokeless tobacco: Never Used     Comment: quit 30 years ago  . Alcohol use Yes     Comment: occ  . Drug use: No  . Sexual activity:  No   Other Topics Concern  . 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 ABDOMINAL HYSTERECTOMY    . CESAREAN SECTION      Family History  Problem Relation Age of Onset  . Hyperlipidemia Mother   . Hypertension Mother   . Mental illness Mother     Allergies  Allergen Reactions  . Sulfa Antibiotics Other (See Comments)    Bad body aches     Current Medications:   Current Outpatient Prescriptions:  .  ibuprofen (ADVIL,MOTRIN) 200 MG tablet, Take 600 mg by mouth as needed., Disp: , Rfl:  .  Multiple Vitamin (MULTIVITAMIN) tablet, Take 1 tablet by mouth daily., Disp: , Rfl:    Review of Systems:   Review of Systems  Constitutional: Negative for chills, fever, malaise/fatigue and weight loss.  HENT: Negative for hearing loss, sinus pain and sore throat.   Respiratory: Negative for cough and hemoptysis.   Cardiovascular: Negative for chest pain, palpitations, leg swelling and PND.  Gastrointestinal: Negative for abdominal pain, constipation, diarrhea, heartburn, nausea and vomiting.  Genitourinary: Negative for dysuria, frequency and urgency.  Musculoskeletal: Negative for back pain, myalgias and neck pain.  Skin: Negative for itching and rash.  Neurological: Negative for dizziness, seizures and headaches.  Endo/Heme/Allergies: Negative for polydipsia.  Psychiatric/Behavioral: Negative for depression. The patient is not nervous/anxious.     Vitals:   Vitals:   05/24/17 1047  BP: 140/82  Pulse: 65  Temp: 98.1 F (36.7 C)  TempSrc: Oral  SpO2: 96%  Weight: 184 lb (83.5 kg)  Height: 5' 8.25" (1.734 m)     Body mass index is 27.77 kg/m.  Physical Exam:   Physical Exam  Constitutional: She appears well-developed and well-nourished. She is cooperative.  Non-toxic appearance. She does not have a sickly appearance. She does not  appear ill. No distress.  HENT:  Head: Normocephalic and atraumatic.  Right Ear: Tympanic membrane, external ear and ear canal normal. Tympanic membrane is not erythematous, not retracted and not bulging.  Left Ear: Tympanic membrane, external ear and ear canal normal. Tympanic membrane is not erythematous, not retracted and not bulging.  Eyes: Pupils are equal, round, and reactive to light. Conjunctivae, EOM and lids are normal.  Neck: Trachea normal and full passive range of motion without pain.  Cardiovascular: Normal rate, regular rhythm, S1 normal, S2 normal, normal heart sounds and intact distal pulses.   Pulmonary/Chest: Effort normal and breath sounds normal. No tachypnea. No respiratory distress. She has no decreased breath sounds. She has no wheezes. She has no rhonchi. She has no rales.  Breast exam performed, no lumps/masses/asymmetry noted. Nipples without deformity or discharge.  Abdominal: Soft. Normal appearance and bowel sounds are normal. There is no tenderness.  Musculoskeletal: Normal range of motion.  Lymphadenopathy:    She has no cervical adenopathy.  Neurological: She is alert. She has normal reflexes. No cranial nerve deficit or sensory deficit. GCS eye subscore is 4. GCS verbal subscore is 5. GCS motor subscore is 6.  Skin: Skin is warm, dry and intact.  Psychiatric: She has a normal mood and affect. Her speech is normal and behavior is normal.  Nursing note and vitals reviewed.   Assessment and Plan:    Claudia Adkins was seen today for establish care.  Diagnoses and all orders for this visit:  Routine general medical examination at a health care facility Today patient counseled on age appropriate routine health concerns for screening and prevention, each reviewed and up to date or declined. Immunizations reviewed and up to date or declined. Labs ordered and reviewed. Risk factors for depression reviewed and negative. Hearing function and visual acuity are intact. ADLs  screened and addressed as needed. Functional ability and level of safety reviewed and appropriate. Education, counseling and referrals performed based on assessed risks today. Patient provided with a copy of personalized plan for preventive services. -     CBC with Differential/Platelet; Future -     Comprehensive metabolic panel; Future  Need for prophylactic vaccination and inoculation against influenza -     Flu Vaccine QUAD 36+ mos IM  Screening for tuberculosis -     TB Skin Test  Encounter for screening for HIV -     HIV antibody; Future  Encounter for screening for other viral diseases -     Hepatitis C antibody; Future  Immunity status testing -     Measles/Mumps/Rubella Immunity; Future -     Varicella zoster antibody, IgG; Future  Anemia, unspecified type Currently asymptomatic, will check CBC today with routine labs. -     CBC with Differential/Platelet; Future  Encounter  for lipid screening for cardiovascular disease She is not fasting today, will return for labs. -     Lipid panel; Future  Patient requires MMR and varicella titer testing and will return on Monday for this, however if she brings records of these on Monday, we will not draw them.  . Reviewed expectations re: course of current medical issues. . Discussed self-management of symptoms. . Outlined signs and symptoms indicating need for more acute intervention. . Patient verbalized understanding and all questions were answered. . See orders for this visit as documented in the electronic medical record. . Patient received an After-Visit Summary.   CMA or LPN served as scribe during this visit. History, Physical, and Plan performed by medical provider. Documentation and orders reviewed and attested to.   Inda Coke, PA-C

## 2017-05-24 NOTE — Patient Instructions (Signed)
It was great to meet you.  Please make an appointment with the lab on your way out. I would like for you to return for lab work around [place] on Monday 05/27/17. After midnight on the day of the lab draw, please do not eat anything. You may have water, black coffee, unsweetened tea.  Please bring your records of titers and vaccines at that time.  Please schedule a visit with Dr. Teresa Coombs here at [facility] to discuss your hand issues.  Please schedule a mammogram.  Health Maintenance, Female Adopting a healthy lifestyle and getting preventive care can go a long way to promote health and wellness. Talk with your health care provider about what schedule of regular examinations is right for you. This is a good chance for you to check in with your provider about disease prevention and staying healthy. In between checkups, there are plenty of things you can do on your own. Experts have done a lot of research about which lifestyle changes and preventive measures are most likely to keep you healthy. Ask your health care provider for more information. Weight and diet Eat a healthy diet  Be sure to include plenty of vegetables, fruits, low-fat dairy products, and lean protein.  Do not eat a lot of foods high in solid fats, added sugars, or salt.  Get regular exercise. This is one of the most important things you can do for your health. ? Most adults should exercise for at least 150 minutes each week. The exercise should increase your heart rate and make you sweat (moderate-intensity exercise). ? Most adults should also do strengthening exercises at least twice a week. This is in addition to the moderate-intensity exercise.  Maintain a healthy weight  Body mass index (BMI) is a measurement that can be used to identify possible weight problems. It estimates body fat based on height and weight. Your health care provider can help determine your BMI and help you achieve or maintain a healthy  weight.  For females 1 years of age and older: ? A BMI below 18.5 is considered underweight. ? A BMI of 18.5 to 24.9 is normal. ? A BMI of 25 to 29.9 is considered overweight. ? A BMI of 30 and above is considered obese.  Watch levels of cholesterol and blood lipids  You should start having your blood tested for lipids and cholesterol at 59 years of age, then have this test every 5 years.  You may need to have your cholesterol levels checked more often if: ? Your lipid or cholesterol levels are high. ? You are older than 59 years of age. ? You are at high risk for heart disease.  Cancer screening Lung Cancer  Lung cancer screening is recommended for adults 61-75 years old who are at high risk for lung cancer because of a history of smoking.  A yearly low-dose CT scan of the lungs is recommended for people who: ? Currently smoke. ? Have quit within the past 15 years. ? Have at least a 30-pack-year history of smoking. A pack year is smoking an average of one pack of cigarettes a day for 1 year.  Yearly screening should continue until it has been 15 years since you quit.  Yearly screening should stop if you develop a health problem that would prevent you from having lung cancer treatment.  Breast Cancer  Practice breast self-awareness. This means understanding how your breasts normally appear and feel.  It also means doing regular breast self-exams.  Let your health care provider know about any changes, no matter how small.  If you are in your 20s or 30s, you should have a clinical breast exam (CBE) by a health care provider every 1-3 years as part of a regular health exam.  If you are 61 or older, have a CBE every year. Also consider having a breast X-ray (mammogram) every year.  If you have a family history of breast cancer, talk to your health care provider about genetic screening.  If you are at high risk for breast cancer, talk to your health care provider about having an  MRI and a mammogram every year.  Breast cancer gene (BRCA) assessment is recommended for women who have family members with BRCA-related cancers. BRCA-related cancers include: ? Breast. ? Ovarian. ? Tubal. ? Peritoneal cancers.  Results of the assessment will determine the need for genetic counseling and BRCA1 and BRCA2 testing.  Cervical Cancer Your health care provider may recommend that you be screened regularly for cancer of the pelvic organs (ovaries, uterus, and vagina). This screening involves a pelvic examination, including checking for microscopic changes to the surface of your cervix (Pap test). You may be encouraged to have this screening done every 3 years, beginning at age 58.  For women ages 57-65, health care providers may recommend pelvic exams and Pap testing every 3 years, or they may recommend the Pap and pelvic exam, combined with testing for human papilloma virus (HPV), every 5 years. Some types of HPV increase your risk of cervical cancer. Testing for HPV may also be done on women of any age with unclear Pap test results.  Other health care providers may not recommend any screening for nonpregnant women who are considered low risk for pelvic cancer and who do not have symptoms. Ask your health care provider if a screening pelvic exam is right for you.  If you have had past treatment for cervical cancer or a condition that could lead to cancer, you need Pap tests and screening for cancer for at least 20 years after your treatment. If Pap tests have been discontinued, your risk factors (such as having a new sexual partner) need to be reassessed to determine if screening should resume. Some women have medical problems that increase the chance of getting cervical cancer. In these cases, your health care provider may recommend more frequent screening and Pap tests.  Colorectal Cancer  This type of cancer can be detected and often prevented.  Routine colorectal cancer screening  usually begins at 59 years of age and continues through 59 years of age.  Your health care provider may recommend screening at an earlier age if you have risk factors for colon cancer.  Your health care provider may also recommend using home test kits to check for hidden blood in the stool.  A small camera at the end of a tube can be used to examine your colon directly (sigmoidoscopy or colonoscopy). This is done to check for the earliest forms of colorectal cancer.  Routine screening usually begins at age 36.  Direct examination of the colon should be repeated every 5-10 years through 59 years of age. However, you may need to be screened more often if early forms of precancerous polyps or small growths are found.  Skin Cancer  Check your skin from head to toe regularly.  Tell your health care provider about any new moles or changes in moles, especially if there is a change in a mole's shape or color.  Also tell your health care provider if you have a mole that is larger than the size of a pencil eraser.  Always use sunscreen. Apply sunscreen liberally and repeatedly throughout the day.  Protect yourself by wearing long sleeves, pants, a wide-brimmed hat, and sunglasses whenever you are outside.  Heart disease, diabetes, and high blood pressure  High blood pressure causes heart disease and increases the risk of stroke. High blood pressure is more likely to develop in: ? People who have blood pressure in the high end of the normal range (130-139/85-89 mm Hg). ? People who are overweight or obese. ? People who are African American.  If you are 3-12 years of age, have your blood pressure checked every 3-5 years. If you are 108 years of age or older, have your blood pressure checked every year. You should have your blood pressure measured twice-once when you are at a hospital or clinic, and once when you are not at a hospital or clinic. Record the average of the two measurements. To check  your blood pressure when you are not at a hospital or clinic, you can use: ? An automated blood pressure machine at a pharmacy. ? A home blood pressure monitor.  If you are between 48 years and 26 years old, ask your health care provider if you should take aspirin to prevent strokes.  Have regular diabetes screenings. This involves taking a blood sample to check your fasting blood sugar level. ? If you are at a normal weight and have a low risk for diabetes, have this test once every three years after 59 years of age. ? If you are overweight and have a high risk for diabetes, consider being tested at a younger age or more often. Preventing infection Hepatitis B  If you have a higher risk for hepatitis B, you should be screened for this virus. You are considered at high risk for hepatitis B if: ? You were born in a country where hepatitis B is common. Ask your health care provider which countries are considered high risk. ? Your parents were born in a high-risk country, and you have not been immunized against hepatitis B (hepatitis B vaccine). ? You have HIV or AIDS. ? You use needles to inject street drugs. ? You live with someone who has hepatitis B. ? You have had sex with someone who has hepatitis B. ? You get hemodialysis treatment. ? You take certain medicines for conditions, including cancer, organ transplantation, and autoimmune conditions.  Hepatitis C  Blood testing is recommended for: ? Everyone born from [birth year] through 1963. ? Anyone with known risk factors for hepatitis C.  Sexually transmitted infections (STIs)  You should be screened for sexually transmitted infections (STIs) including gonorrhea and chlamydia if: ? You are sexually active and are younger than 60 years of age. ? You are older than 59 years of age and your health care provider tells you that you are at risk for this type of infection. ? Your sexual activity has changed since you were last screened and you  are at an increased risk for chlamydia or gonorrhea. Ask your health care provider if you are at risk.  If you do not have HIV, but are at risk, it may be recommended that you take a prescription medicine daily to prevent HIV infection. This is called pre-exposure prophylaxis (PrEP). You are considered at risk if: ? You are sexually active and do not regularly use condoms or know the HIV status of  your partner(s). ? You take drugs by injection. ? You are sexually active with a partner who has HIV.  Talk with your health care provider about whether you are at high risk of being infected with HIV. If you choose to begin PrEP, you should first be tested for HIV. You should then be tested every 3 months for as long as you are taking PrEP. Pregnancy  If you are premenopausal and you may become pregnant, ask your health care provider about preconception counseling.  If you may become pregnant, take 400 to 800 micrograms (mcg) of folic acid every day.  If you want to prevent pregnancy, talk to your health care provider about birth control (contraception). Osteoporosis and menopause  Osteoporosis is a disease in which the bones lose minerals and strength with aging. This can result in serious bone fractures. Your risk for osteoporosis can be identified using a bone density scan.  If you are 78 years of age or older, or if you are at risk for osteoporosis and fractures, ask your health care provider if you should be screened.  Ask your health care provider whether you should take a calcium or vitamin D supplement to lower your risk for osteoporosis.  Menopause may have certain physical symptoms and risks.  Hormone replacement therapy may reduce some of these symptoms and risks. Talk to your health care provider about whether hormone replacement therapy is right for you. Follow these instructions at home:  Schedule regular health, dental, and eye exams.  Stay current with your  immunizations.  Do not use any tobacco products including cigarettes, chewing tobacco, or electronic cigarettes.  If you are pregnant, do not drink alcohol.  If you are breastfeeding, limit how much and how often you drink alcohol.  Limit alcohol intake to no more than 1 drink per day for nonpregnant women. One drink equals 12 ounces of beer, 5 ounces of wine, or 1 ounces of hard liquor.  Do not use street drugs.  Do not share needles.  Ask your health care provider for help if you need support or information about quitting drugs.  Tell your health care provider if you often feel depressed.  Tell your health care provider if you have ever been abused or do not feel safe at home. This information is not intended to replace advice given to you by your health care provider. Make sure you discuss any questions you have with your health care provider. Document Released: 02/12/2011 Document Revised: 01/05/2016 Document Reviewed: 05/03/2015 Elsevier Interactive Patient Education  [employer].

## 2017-05-27 ENCOUNTER — Other Ambulatory Visit (INDEPENDENT_AMBULATORY_CARE_PROVIDER_SITE_OTHER): Payer: PRIVATE HEALTH INSURANCE

## 2017-05-27 ENCOUNTER — Encounter: Payer: Self-pay | Admitting: Physician Assistant

## 2017-05-27 DIAGNOSIS — Z13220 Encounter for screening for lipoid disorders: Secondary | ICD-10-CM | POA: Diagnosis not present

## 2017-05-27 DIAGNOSIS — Z114 Encounter for screening for human immunodeficiency virus [HIV]: Secondary | ICD-10-CM

## 2017-05-27 DIAGNOSIS — D649 Anemia, unspecified: Secondary | ICD-10-CM

## 2017-05-27 DIAGNOSIS — Z0000 Encounter for general adult medical examination without abnormal findings: Secondary | ICD-10-CM | POA: Diagnosis not present

## 2017-05-27 DIAGNOSIS — Z136 Encounter for screening for cardiovascular disorders: Secondary | ICD-10-CM | POA: Diagnosis not present

## 2017-05-27 DIAGNOSIS — Z1159 Encounter for screening for other viral diseases: Secondary | ICD-10-CM

## 2017-05-27 LAB — COMPREHENSIVE METABOLIC PANEL
ALBUMIN: 4.2 g/dL (ref 3.5–5.2)
ALK PHOS: 61 U/L (ref 39–117)
ALT: 15 U/L (ref 0–35)
AST: 16 U/L (ref 0–37)
BUN: 15 mg/dL (ref 6–23)
CALCIUM: 9.3 mg/dL (ref 8.4–10.5)
CHLORIDE: 106 meq/L (ref 96–112)
CO2: 28 mEq/L (ref 19–32)
Creatinine, Ser: 0.81 mg/dL (ref 0.40–1.20)
GFR: 92.84 mL/min (ref >60.00)
Glucose, Bld: 89 mg/dL (ref 70–99)
Potassium: 3.5 mEq/L (ref 3.5–5.1)
SODIUM: 142 meq/L (ref 135–145)
Total Bilirubin: 0.6 mg/dL (ref 0.2–1.2)
Total Protein: 7.5 g/dL (ref 6.0–8.3)

## 2017-05-27 LAB — LIPID PANEL
CHOLESTEROL: 205 mg/dL — AB (ref 0–200)
HDL: 58.2 mg/dL (ref >39.00)
LDL CALC: 133 mg/dL — AB (ref 0–99)
NonHDL: 146.97
Total CHOL/HDL Ratio: 4
Triglycerides: 68 mg/dL (ref 0.0–149.0)
VLDL: 13.6 mg/dL (ref 0.0–40.0)

## 2017-05-27 LAB — CBC WITH DIFFERENTIAL/PLATELET
BASOS PCT: 0.4 % (ref 0.0–3.0)
Basophils Absolute: 0 10*3/uL (ref 0.0–0.1)
EOS PCT: 2.6 % (ref 0.0–5.0)
Eosinophils Absolute: 0.1 10*3/uL (ref 0.0–0.7)
HCT: 39.1 % (ref 36.0–46.0)
HEMOGLOBIN: 12.9 g/dL (ref 12.0–15.0)
LYMPHS ABS: 1.9 10*3/uL (ref 0.7–4.0)
Lymphocytes Relative: 41.9 % (ref 12.0–46.0)
MCHC: 32.9 g/dL (ref 30.0–36.0)
MCV: 91.5 fl (ref 78.0–100.0)
MONO ABS: 0.3 10*3/uL (ref 0.1–1.0)
Monocytes Relative: 7.5 % (ref 3.0–12.0)
Neutro Abs: 2.1 10*3/uL (ref 1.4–7.7)
Neutrophils Relative %: 47.6 % (ref 43.0–77.0)
Platelets: 193 10*3/uL (ref 150.0–400.0)
RBC: 4.27 Mil/uL (ref 3.87–5.11)
RDW: 13.9 % (ref 11.5–15.5)
WBC: 4.4 10*3/uL (ref 4.0–10.5)

## 2017-05-27 LAB — TB SKIN TEST
Induration: 0 mm
TB Skin Test: NEGATIVE

## 2017-05-28 LAB — HEPATITIS C ANTIBODY
Hepatitis C Ab: NONREACTIVE
SIGNAL TO CUT-OFF: 0.2 (ref <1.00)

## 2017-05-28 LAB — HIV ANTIBODY (ROUTINE TESTING W REFLEX): HIV 1&2 Ab, 4th Generation: NONREACTIVE

## 2017-06-04 ENCOUNTER — Ambulatory Visit: Payer: PRIVATE HEALTH INSURANCE

## 2017-06-04 ENCOUNTER — Ambulatory Visit: Payer: PRIVATE HEALTH INSURANCE | Admitting: Sports Medicine

## 2017-06-04 ENCOUNTER — Ambulatory Visit (INDEPENDENT_AMBULATORY_CARE_PROVIDER_SITE_OTHER): Payer: PRIVATE HEALTH INSURANCE

## 2017-06-04 DIAGNOSIS — Z111 Encounter for screening for respiratory tuberculosis: Secondary | ICD-10-CM

## 2017-06-06 LAB — TB SKIN TEST
INDURATION: NEGATIVE mm
TB Skin Test: NEGATIVE

## 2017-06-07 ENCOUNTER — Telehealth: Payer: Self-pay | Admitting: Physician Assistant

## 2017-06-07 NOTE — Telephone Encounter (Signed)
Spoke to [facility] and advised we are unable to print requested information. Advised ok to manually write, sign and fax; faxed as requested

## 2017-06-07 NOTE — Telephone Encounter (Signed)
Tomasa Hosteller from [employer] called stating that they received a letter/fax from [employer] regarding the patient's recent physical and flu shot. She said that they are needing more information (lot number, expiration date and location of administration). She will be working at [facility]. It can be faxed to [phone].

## 2017-06-11 ENCOUNTER — Ambulatory Visit: Payer: PRIVATE HEALTH INSURANCE | Admitting: Sports Medicine

## 2017-06-13 ENCOUNTER — Ambulatory Visit (INDEPENDENT_AMBULATORY_CARE_PROVIDER_SITE_OTHER): Payer: PRIVATE HEALTH INSURANCE | Admitting: Sports Medicine

## 2017-06-13 ENCOUNTER — Encounter: Payer: Self-pay | Admitting: Sports Medicine

## 2017-06-13 VITALS — BP 120/80 | HR 78 | Ht 69.0 in | Wt 185.0 lb

## 2017-06-13 DIAGNOSIS — M4722 Other spondylosis with radiculopathy, cervical region: Secondary | ICD-10-CM | POA: Diagnosis not present

## 2017-06-13 DIAGNOSIS — G5601 Carpal tunnel syndrome, right upper limb: Secondary | ICD-10-CM

## 2017-06-13 MED ORDER — DICLOFENAC SODIUM 2 % TD SOLN
1.0000 | Freq: Two times a day (BID) | TRANSDERMAL | 0 refills | Status: AC
Start: 2017-06-13 — End: 2017-06-14

## 2017-06-13 NOTE — Progress Notes (Signed)
OFFICE VISIT NOTE Claudia Adkins. Claudia Adkins, [place] at [facility] [phone]  Claudia Adkins - 61 y.o. female MRN [id number]  Date of birth: 29-Jun-1956  Visit Date: 06/13/2017  PCP: Claudia Coke, PA   Referred by: Claudia Adkins, [place]  Claudia Adkins PT, LAT, ATC acting as scribe for Dr. Paulla Adkins.  SUBJECTIVE:   Chief Complaint  Patient presents with  . New Patient (Initial Visit)    R hand pain   HPI: As below and per problem based documentation when appropriate.  Claudia Adkins is a new pt presenting today w/ c/o R hand tingling that begins in her neck and travels through her R arm and into her R hand.  She states that she has the tingling in her entire R hand.  She notes that she saw Dr. Sharlet Adkins, a chiropractor, x 6 visits for R sided neck tension and R hand tingling.  She notes that she did get some relief from this treatment but notes that the symptoms have returned.  She notes that these symptoms began in late March - early April and then subsided w/ the chiro care and has returned since September 2018.  Pt denies any aggravating activities.  Pt notes that she uses Biofreeze intermittently for her R-sided neck soreness/tightness but is not using any medication or ice/heat.      Review of Systems  Constitutional: Negative for chills, fever, malaise/fatigue and weight loss.  HENT: Negative.   Eyes: Negative.   Respiratory: Negative for cough, hemoptysis, shortness of breath and wheezing.   Cardiovascular: Negative for chest pain and palpitations.  Gastrointestinal: Negative.   Genitourinary: Negative.   Musculoskeletal: Positive for neck pain. Negative for falls.  Skin: Negative.   Neurological: Negative for dizziness, seizures and headaches.  Endo/Heme/Allergies: Negative for polydipsia.  Psychiatric/Behavioral: Negative.     Otherwise per HPI.    HISTORY & PERTINENT PRIOR DATA:  Prior History reviewed and updated per electronic  medical record. Significant history, findings, studies and interim changes include: No additional findings.  reports that she has quit smoking. Her smoking use included cigarettes. She has a 5.00 pack-year smoking history. she has never used smokeless tobacco. No results for input(s): HGBA1C, LABURIC, CREATINE in the last 8760 hours. Problem  Carpal Tunnel Syndrome of Right Wrist  Osteoarthritis of Spine With Radiculopathy, Cervical Region    OBJECTIVE:  VS:  HT:5\' 9"  (175.3 cm)   WT:185 lb (83.9 kg)  BMI:27.31    BP:120/80  HR:78bpm  TEMP: ( )  RESP:99 %  PHYSICAL EXAM: Constitutional: WDWN, Non-toxic appearing. Psychiatric: Alert & appropriately interactive.Not depressed or anxious appearing. Respiratory: No increased work of breathing. Trachea Midline Eyes: Pupils are equal. EOM intact without nystagmus. No scleral icterus  UPPER EXTREMITIES No clubbing or cyanosis appreciated Capillary Refill is normal, less than 2 seconds No signficant upper extremity generalized edema Radial Pulses: Normal and symmetrically palpable Sensation in UE dermatomes: non-dermatomal generalized dysthesia in: Bilateral hands  Neck:   Well aligned, no significant torticollis  Midline Bony TTP: none   Paraspinal Muscle Spasm: No  She does have some periscapular pain that is mild.  Anterior carriage of the head and shoulders.  CERVICAL ROM: normal range of motion  supple  NEURAL TENSION SIGNS Right Left  Brachial Plexus Squeeze: normal, no pain normal, no pain  Arm Squeeze Test: normal, no pain normal, no pain  Spurling's Compression Test: normal, no pain normal, no pain  Lhermitte's Compression test: Negative,  no radiating pain   REFLEXES Right Left  DTR - C5 -Biceps  2+ 2+  DTR - C6 - Brachiorad 2+ 2+  DTR - C7 - Triceps 2+ 2+   UMN - Hoffman's Negative/Normal Negative/Normal  UMN - Pectoral Negative/Normal Negative/Normal   MOTOR TESTING: She has slightly decreased grip  strength in the right hand.  However all other upper extremity myotomes are intact.  Hand: She is a small amount of the rash view of the left but this is minimal.      Grip strength measured via hand dynamometer with findings as follows: Lbs of pressure Right   Left Trial 1   50   60 Trial 2   50   55 Trial 3   50   55 Avg   50   56.7    ASSESSMENT & PLAN:   1. Osteoarthritis of spine with radiculopathy, cervical region   2. Carpal tunnel syndrome of right wrist    Plan: Symptoms do seem consistent with OA of the spine and carpal tunnel syndrome with possible double crush symptoms but this is more likely reflective of axial neck and upper thoracic pain with superimposed carpal tunnel.  Osteoarthritis of spine with radiculopathy, cervical region Recommend f/u with Claudia Adkins Continue HEP and add foundations training If any lack of improvement or worsening hand symptoms needs [facility] [place] of wrist +/- NCS/EMG and then consider Cervical MRI No red flag symptoms  Carpal tunnel syndrome of right wrist Injection offered today but declined Wrist splint recommended during activity and at night Topical Pennsaid sample provided. If improved would favor CTS injection   ++++++++++++++++++++++++++++++++++++++++++++ Orders:  No orders of the defined types were placed in this encounter.   Meds:  Meds ordered this encounter  Medications  . Diclofenac Sodium (PENNSAID) 2 % SOLN    Sig: Place 1 application onto the skin 2 (two) times daily.    Dispense:  8 g    Refill:  0    ++++++++++++++++++++++++++++++++++++++++++++ Follow-up: Return if symptoms worsen or fail to improve.   Pertinent documentation may be included in additional procedure notes, imaging studies, problem based documentation and patient instructions. Please see these sections of the encounter for additional information regarding this visit. CMA/ATC served as Education administrator during this visit. History, Physical, and Plan  performed by medical provider. Documentation and orders reviewed and attested to.      Claudia Adkins, [facility] Physician

## 2017-06-13 NOTE — Assessment & Plan Note (Signed)
Injection offered today but declined Wrist splint recommended during activity and at night Topical Pennsaid sample provided. If improved would favor CTS injection

## 2017-06-13 NOTE — Patient Instructions (Signed)

## 2017-06-13 NOTE — Assessment & Plan Note (Addendum)
Recommend f/u with Dr. Jimmye Norman Continue HEP and add foundations training If any lack of improvement or worsening hand symptoms needs MMS[facility] [place] of wrist +/- NCS/EMG and then consider Cervical MRI No red flag symptoms

## 2017-07-12 ENCOUNTER — Encounter: Payer: Self-pay | Admitting: Sports Medicine

## 2017-12-20 ENCOUNTER — Encounter: Payer: Self-pay | Admitting: Physician Assistant

## 2017-12-20 ENCOUNTER — Ambulatory Visit (INDEPENDENT_AMBULATORY_CARE_PROVIDER_SITE_OTHER): Payer: BC Managed Care – PPO | Admitting: Physician Assistant

## 2017-12-20 VITALS — BP 130/86 | HR 66 | Ht 69.0 in | Wt 186.2 lb

## 2017-12-20 DIAGNOSIS — H6123 Impacted cerumen, bilateral: Secondary | ICD-10-CM

## 2017-12-20 DIAGNOSIS — G4700 Insomnia, unspecified: Secondary | ICD-10-CM

## 2017-12-20 MED ORDER — TRAZODONE HCL 50 MG PO TABS: 50.0000 mg | ORAL_TABLET | Freq: Every day | ORAL | 0 refills | Status: DC

## 2017-12-20 NOTE — Patient Instructions (Addendum)
It was great to see you!  I strongly recommend a sleep study, and I have discussed this with the doctors. I have put this order in for you.  In the meantime, I have prescribed you trazodone. I recommend cutting the tablet in half and trying this half-dose first. Do not use with benadryl.

## 2017-12-20 NOTE — Progress Notes (Signed)
Claudia Adkins is a 62 y.o. female here for a new problem.  I acted as a Education administrator for [employer], PA-C Claudia Pickler, LPN   History of Present Illness:   Chief Complaint  Patient presents with  . Right ear pain  . Insomnia    Otalgia   There is pain in the right ear. This is a new problem. Episode onset: Started 2 days ago. The problem occurs constantly. The problem has been gradually worsening. There has been no fever. The pain is at a severity of 3/10. The pain is mild. Associated symptoms include hearing loss and neck pain (Right side). Pertinent negatives include no ear discharge, headaches or sore throat. Treatments tried: Ibuprofen. The treatment provided moderate relief. There is no history of a chronic ear infection, hearing loss or a tympanostomy tube.  Insomnia  Primary symptoms: sleep disturbance, frequent awakening, malaise/fatigue.  The current episode started more than one year. The onset quality is undetermined. The problem occurs every several days. The problem has been gradually worsening since onset. Treatments tried: Benadryl 75 mg and Melatonin 15 mg. The treatment provided no relief. Sleeping: 10: 00 AM to 12:00 or 1:00 PM.  How long after going to bed to you fall asleep: 30-60 minutes.   Sleep duration: None.    Patient reports that she does not snore.  She has had issues with sleeping for quite some time.  Even if she takes 75 mg of Benadryl she is not able to stay asleep.  She has never had a sleep study.   Past Medical History:  Diagnosis Date  . Allergy   . Anemia      Social History   Socioeconomic History  . Marital status: Widowed    Spouse name: Not on file  . Number of children: Not on file  . Years of education: Not on file  . Highest education level: Not on file  Occupational History  . Not on file  Social Needs  . Financial resource strain: Not on file  . Food insecurity:    Worry: Not on file    Inability: Not on file  .  Transportation needs:    Medical: Not on file    Non-medical: Not on file  Tobacco Use  . Smoking status: Former Smoker    Packs/day: 0.50    Years: 10.00    Pack years: 5.00    Types: Cigarettes  . Smokeless tobacco: Never Used  . Tobacco comment: quit 30 years ago  Substance and Sexual Activity  . Alcohol use: Yes    Comment: occ  . Drug use: No  . Sexual activity: Never  Lifestyle  . Physical activity:    Days per week: Not on file    Minutes per session: Not on file  . Stress: Not on file  Relationships  . Social connections:    Talks on phone: Not on file    Gets together: Not on file    Attends religious service: Not on file    Active member of club or organization: Not on file    Attends meetings of clubs or organizations: Not on file    Relationship status: Not on file  . Intimate partner violence:    Fear of current or ex partner: Not on file    Emotionally abused: Not on file    Physically abused: Not on file    Forced sexual activity: Not on file  Other Topics Concern  . 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 ABDOMINAL HYSTERECTOMY    . CESAREAN SECTION      Family History  Problem Relation Age of Onset  . Hyperlipidemia Mother   . Hypertension Mother   . Mental illness Mother     Allergies  Allergen Reactions  . Sulfa Antibiotics Other (See Comments)    Bad body aches    Current Medications:   Current Outpatient Medications:  .  ibuprofen (ADVIL,MOTRIN) 200 MG tablet, Take 600 mg by mouth as needed., Disp: , Rfl:  .  Multiple Vitamin (MULTIVITAMIN) tablet, Take 1 tablet by mouth daily., Disp: , Rfl:  .  traZODone (DESYREL) 50 MG tablet, Take 1 tablet (50 mg total) by mouth at bedtime., Disp: 30 tablet, Rfl: 0   Review of Systems:   Review of Systems  Constitutional: Positive for malaise/fatigue.   HENT: Positive for ear pain and hearing loss. Negative for ear discharge and sore throat.   Musculoskeletal: Positive for neck pain (Right side).  Neurological: Negative for headaches.  Psychiatric/Behavioral: Positive for sleep disturbance. The patient has insomnia.     Vitals:   Vitals:   12/20/17 1033  BP: 130/86  Pulse: 66  SpO2: 97%  Weight: 186 lb 4 oz (84.5 kg)  Height: 5\' 9"  (1.753 m)     Body mass index is 27.5 kg/m.  Physical Exam:   Physical Exam  Constitutional: She appears well-developed. She is cooperative.  Non-toxic appearance. She does not have a sickly appearance. She does not appear ill. No distress.  HENT:  Head: Normocephalic and atraumatic.  Right Ear: External ear and ear canal normal.  Left Ear: External ear and ear canal normal.  Nose: Nose normal. Right sinus exhibits no maxillary sinus tenderness and no frontal sinus tenderness. Left sinus exhibits no maxillary sinus tenderness and no frontal sinus tenderness.  Mouth/Throat: Uvula is midline. No posterior oropharyngeal edema or posterior oropharyngeal erythema.  Bilateral cerumen impaction  Eyes: Conjunctivae and lids are normal.  Neck: Trachea normal.  Cardiovascular: Normal rate, regular rhythm, S1 normal, S2 normal and normal heart sounds.  Pulmonary/Chest: Effort normal and breath sounds normal. She has no decreased breath sounds. She has no wheezes. She has no rhonchi. She has no rales.  Lymphadenopathy:    She has no cervical adenopathy.  Neurological: She is alert.  Skin: Skin is warm, dry and intact.  Psychiatric: She has a normal mood and affect. Her speech is normal and behavior is normal.  Nursing note and vitals reviewed.  Procedure: Cerumen Disimpaction Claudia Adkins drops applied and gentle ear lavage performed bilterally. There were no complications and following the disimpaction the tympanic membrane was visible on the right only -- unable to remove cerumen on the L. Tympanic membrane  was intact following the procedure.  Auditory canals are normal.  The patient reported partial relief of symptoms after removal of cerumen.    Assessment and Plan:    Claudia Adkins was seen today for right ear pain and insomnia.  Diagnoses and all orders for this visit:  Insomnia, unspecified type Discussed briefly with Dr. Briscoe Deutscher.  I recommended that she discontinue her 75 mg of Benadryl nightly.  I did prescribe her a short low-dose prescription of trazodone.  I recommended that she take 25 mg at her first dose to see how this affects her.  I also  recommended that she have a sleep study.  Patient was reluctant about sleep study and is waiting to see if her insurance will pay it prior to actually scheduling it. -     Ambulatory referral to Sleep Studies  Bilateral impacted cerumen We were unable to remove cerumen from left ear.  Successful cerumen removal of right ear.  She would like a referral to ENT for further evaluation and another attempt at removal of cerumen impaction on left side.  I have placed this referral today.-     Ambulatory referral to ENT -     Ear wax removal  Other orders -     traZODone (DESYREL) 50 MG tablet; Take 1 tablet (50 mg total) by mouth at bedtime.    . Reviewed expectations re: course of current medical issues. . Discussed self-management of symptoms. . Outlined signs and symptoms indicating need for more acute intervention. . Patient verbalized understanding and all questions were answered. . See orders for this visit as documented in the electronic medical record. . Patient received an After-Visit Summary.  CMA or LPN served as scribe during this visit. History, Physical, and Plan performed by medical provider. Documentation and orders reviewed and attested to.  Inda Coke, PA-C

## 2018-05-26 ENCOUNTER — Encounter: Payer: BC Managed Care – PPO | Admitting: Physician Assistant

## 2018-05-28 ENCOUNTER — Ambulatory Visit (INDEPENDENT_AMBULATORY_CARE_PROVIDER_SITE_OTHER): Payer: BC Managed Care – PPO | Admitting: Physician Assistant

## 2018-05-28 ENCOUNTER — Encounter: Payer: BC Managed Care – PPO | Admitting: Physician Assistant

## 2018-05-28 ENCOUNTER — Other Ambulatory Visit: Payer: Self-pay | Admitting: Physician Assistant

## 2018-05-28 ENCOUNTER — Encounter: Payer: Self-pay | Admitting: Physician Assistant

## 2018-05-28 VITALS — BP 124/80 | HR 59 | Temp 98.1°F | Ht 69.0 in | Wt 180.2 lb

## 2018-05-28 DIAGNOSIS — E785 Hyperlipidemia, unspecified: Secondary | ICD-10-CM | POA: Diagnosis not present

## 2018-05-28 DIAGNOSIS — R5381 Other malaise: Secondary | ICD-10-CM

## 2018-05-28 DIAGNOSIS — D649 Anemia, unspecified: Secondary | ICD-10-CM | POA: Diagnosis not present

## 2018-05-28 DIAGNOSIS — R2241 Localized swelling, mass and lump, right lower limb: Secondary | ICD-10-CM

## 2018-05-28 DIAGNOSIS — Z0000 Encounter for general adult medical examination without abnormal findings: Secondary | ICD-10-CM | POA: Diagnosis not present

## 2018-05-28 DIAGNOSIS — Z0001 Encounter for general adult medical examination with abnormal findings: Secondary | ICD-10-CM

## 2018-05-28 LAB — CBC WITH DIFFERENTIAL/PLATELET
BASOS ABS: 0 10*3/uL (ref 0.0–0.1)
Basophils Relative: 0.5 % (ref 0.0–3.0)
EOS ABS: 0.1 10*3/uL (ref 0.0–0.7)
Eosinophils Relative: 2.8 % (ref 0.0–5.0)
HEMATOCRIT: 37.9 % (ref 36.0–46.0)
Hemoglobin: 12.3 g/dL (ref 12.0–15.0)
LYMPHS PCT: 38.7 % (ref 12.0–46.0)
Lymphs Abs: 1.9 10*3/uL (ref 0.7–4.0)
MCHC: 32.4 g/dL (ref 30.0–36.0)
MCV: 92.3 fl (ref 78.0–100.0)
MONO ABS: 0.5 10*3/uL (ref 0.1–1.0)
Monocytes Relative: 9.5 % (ref 3.0–12.0)
NEUTROS ABS: 2.4 10*3/uL (ref 1.4–7.7)
NEUTROS PCT: 48.5 % (ref 43.0–77.0)
PLATELETS: 199 10*3/uL (ref 150.0–400.0)
RBC: 4.11 Mil/uL (ref 3.87–5.11)
RDW: 13.3 % (ref 11.5–15.5)
WBC: 5 10*3/uL (ref 4.0–10.5)

## 2018-05-28 LAB — COMPREHENSIVE METABOLIC PANEL
ALT: 14 U/L (ref 0–35)
AST: 14 U/L (ref 0–37)
Albumin: 3.8 g/dL (ref 3.5–5.2)
Alkaline Phosphatase: 56 U/L (ref 39–117)
BILIRUBIN TOTAL: 0.4 mg/dL (ref 0.2–1.2)
BUN: 17 mg/dL (ref 6–23)
CALCIUM: 9 mg/dL (ref 8.4–10.5)
CO2: 30 meq/L (ref 19–32)
CREATININE: 0.91 mg/dL (ref 0.40–1.20)
Chloride: 108 mEq/L (ref 96–112)
GFR: 80.89 mL/min (ref >60.00)
GLUCOSE: 62 mg/dL — AB (ref 70–99)
Potassium: 3.6 mEq/L (ref 3.5–5.1)
SODIUM: 144 meq/L (ref 135–145)
Total Protein: 6.8 g/dL (ref 6.0–8.3)

## 2018-05-28 LAB — LIPID PANEL
CHOL/HDL RATIO: 4
Cholesterol: 186 mg/dL (ref 0–200)
HDL: 53.2 mg/dL (ref >39.00)
LDL CALC: 119 mg/dL — AB (ref 0–99)
NONHDL: 133.27
Triglycerides: 72 mg/dL (ref 0.0–149.0)
VLDL: 14.4 mg/dL (ref 0.0–40.0)

## 2018-05-28 NOTE — Patient Instructions (Signed)
It was great to see you!  1. Schedule mammogram 2. Go to the eye doctor  Keep me posted about your foot!  Take care,  [place] Maintenance, Female Adopting a healthy lifestyle and getting preventive care can go a long way to promote health and wellness. Talk with your health care provider about what schedule of regular examinations is right for you. This is a good chance for you to check in with your provider about disease prevention and staying healthy. In between checkups, there are plenty of things you can do on your own. Experts have done a lot of research about which lifestyle changes and preventive measures are most likely to keep you healthy. Ask your health care provider for more information. Weight and diet Eat a healthy diet  Be sure to include plenty of vegetables, fruits, low-fat dairy products, and lean protein.  Do not eat a lot of foods high in solid fats, added sugars, or salt.  Get regular exercise. This is one of the most important things you can do for your health. ? Most adults should exercise for at least 150 minutes each week. The exercise should increase your heart rate and make you sweat (moderate-intensity exercise). ? Most adults should also do strengthening exercises at least twice a week. This is in addition to the moderate-intensity exercise.  Maintain a healthy weight  Body mass index (BMI) is a measurement that can be used to identify possible weight problems. It estimates body fat based on height and weight. Your health care provider can help determine your BMI and help you achieve or maintain a healthy weight.  For females 17 years of age and older: ? A BMI below 18.5 is considered underweight. ? A BMI of 18.5 to 24.9 is normal. ? A BMI of 25 to 29.9 is considered overweight. ? A BMI of 30 and above is considered obese.  Watch levels of cholesterol and blood lipids  You should start having your blood tested for lipids and  cholesterol at 60 years of age, then have this test every 5 years.  You may need to have your cholesterol levels checked more often if: ? Your lipid or cholesterol levels are high. ? You are older than 60 years of age. ? You are at high risk for heart disease.  Cancer screening Lung Cancer  Lung cancer screening is recommended for adults 59-75 years old who are at high risk for lung cancer because of a history of smoking.  A yearly low-dose CT scan of the lungs is recommended for people who: ? Currently smoke. ? Have quit within the past 15 years. ? Have at least a 30-pack-year history of smoking. A pack year is smoking an average of one pack of cigarettes a day for 1 year.  Yearly screening should continue until it has been 15 years since you quit.  Yearly screening should stop if you develop a health problem that would prevent you from having lung cancer treatment.  Breast Cancer  Practice breast self-awareness. This means understanding how your breasts normally appear and feel.  It also means doing regular breast self-exams. Let your health care provider know about any changes, no matter how small.  If you are in your 20s or 30s, you should have a clinical breast exam (CBE) by a health care provider every 1-3 years as part of a regular health exam.  If you are 41 or older, have a CBE every year. Also consider having a  breast X-ray (mammogram) every year.  If you have a family history of breast cancer, talk to your health care provider about genetic screening.  If you are at high risk for breast cancer, talk to your health care provider about having an MRI and a mammogram every year.  Breast cancer gene (BRCA) assessment is recommended for women who have family members with BRCA-related cancers. BRCA-related cancers include: ? Breast. ? Ovarian. ? Tubal. ? Peritoneal cancers.  Results of the assessment will determine the need for genetic counseling and BRCA1 and BRCA2  testing.  Cervical Cancer Your health care provider may recommend that you be screened regularly for cancer of the pelvic organs (ovaries, uterus, and vagina). This screening involves a pelvic examination, including checking for microscopic changes to the surface of your cervix (Pap test). You may be encouraged to have this screening done every 3 years, beginning at age 56.  For women ages 52-65, health care providers may recommend pelvic exams and Pap testing every 3 years, or they may recommend the Pap and pelvic exam, combined with testing for human papilloma virus (HPV), every 5 years. Some types of HPV increase your risk of cervical cancer. Testing for HPV may also be done on women of any age with unclear Pap test results.  Other health care providers may not recommend any screening for nonpregnant women who are considered low risk for pelvic cancer and who do not have symptoms. Ask your health care provider if a screening pelvic exam is right for you.  If you have had past treatment for cervical cancer or a condition that could lead to cancer, you need Pap tests and screening for cancer for at least 20 years after your treatment. If Pap tests have been discontinued, your risk factors (such as having a new sexual partner) need to be reassessed to determine if screening should resume. Some women have medical problems that increase the chance of getting cervical cancer. In these cases, your health care provider may recommend more frequent screening and Pap tests.  Colorectal Cancer  This type of cancer can be detected and often prevented.  Routine colorectal cancer screening usually begins at 60 years of age and continues through 60 years of age.  Your health care provider may recommend screening at an earlier age if you have risk factors for colon cancer.  Your health care provider may also recommend using home test kits to check for hidden blood in the stool.  A small camera at the end of a  tube can be used to examine your colon directly (sigmoidoscopy or colonoscopy). This is done to check for the earliest forms of colorectal cancer.  Routine screening usually begins at age 82.  Direct examination of the colon should be repeated every 5-10 years through 60 years of age. However, you may need to be screened more often if early forms of precancerous polyps or small growths are found.  Skin Cancer  Check your skin from head to toe regularly.  Tell your health care provider about any new moles or changes in moles, especially if there is a change in a mole's shape or color.  Also tell your health care provider if you have a mole that is larger than the size of a pencil eraser.  Always use sunscreen. Apply sunscreen liberally and repeatedly throughout the day.  Protect yourself by wearing long sleeves, pants, a wide-brimmed hat, and sunglasses whenever you are outside.  Heart disease, diabetes, and high blood pressure  High  blood pressure causes heart disease and increases the risk of stroke. High blood pressure is more likely to develop in: ? People who have blood pressure in the high end of the normal range (130-139/85-89 mm Hg). ? People who are overweight or obese. ? People who are African American.  If you are 80-3 years of age, have your blood pressure checked every 3-5 years. If you are 28 years of age or older, have your blood pressure checked every year. You should have your blood pressure measured twice-once when you are at a hospital or clinic, and once when you are not at a hospital or clinic. Record the average of the two measurements. To check your blood pressure when you are not at a hospital or clinic, you can use: ? An automated blood pressure machine at a pharmacy. ? A home blood pressure monitor.  If you are between 14 years and 15 years old, ask your health care provider if you should take aspirin to prevent strokes.  Have regular diabetes screenings. This  involves taking a blood sample to check your fasting blood sugar level. ? If you are at a normal weight and have a low risk for diabetes, have this test once every three years after 60 years of age. ? If you are overweight and have a high risk for diabetes, consider being tested at a younger age or more often. Preventing infection Hepatitis B  If you have a higher risk for hepatitis B, you should be screened for this virus. You are considered at high risk for hepatitis B if: ? You were born in a country where hepatitis B is common. Ask your health care provider which countries are considered high risk. ? Your parents were born in a high-risk country, and you have not been immunized against hepatitis B (hepatitis B vaccine). ? You have HIV or AIDS. ? You use needles to inject street drugs. ? You live with someone who has hepatitis B. ? You have had sex with someone who has hepatitis B. ? You get hemodialysis treatment. ? You take certain medicines for conditions, including cancer, organ transplantation, and autoimmune conditions.  Hepatitis C  Blood testing is recommended for: ? Everyone born from [birth year] through 1963. ? Anyone with known risk factors for hepatitis C.  Sexually transmitted infections (STIs)  You should be screened for sexually transmitted infections (STIs) including gonorrhea and chlamydia if: ? You are sexually active and are younger than 60 years of age. ? You are older than 60 years of age and your health care provider tells you that you are at risk for this type of infection. ? Your sexual activity has changed since you were last screened and you are at an increased risk for chlamydia or gonorrhea. Ask your health care provider if you are at risk.  If you do not have HIV, but are at risk, it may be recommended that you take a prescription medicine daily to prevent HIV infection. This is called pre-exposure prophylaxis (PrEP). You are considered at risk if: ? You are  sexually active and do not regularly use condoms or know the HIV status of your partner(s). ? You take drugs by injection. ? You are sexually active with a partner who has HIV.  Talk with your health care provider about whether you are at high risk of being infected with HIV. If you choose to begin PrEP, you should first be tested for HIV. You should then be tested every 3 months for  as long as you are taking PrEP. Pregnancy  If you are premenopausal and you may become pregnant, ask your health care provider about preconception counseling.  If you may become pregnant, take 400 to 800 micrograms (mcg) of folic acid every day.  If you want to prevent pregnancy, talk to your health care provider about birth control (contraception). Osteoporosis and menopause  Osteoporosis is a disease in which the bones lose minerals and strength with aging. This can result in serious bone fractures. Your risk for osteoporosis can be identified using a bone density scan.  If you are 70 years of age or older, or if you are at risk for osteoporosis and fractures, ask your health care provider if you should be screened.  Ask your health care provider whether you should take a calcium or vitamin D supplement to lower your risk for osteoporosis.  Menopause may have certain physical symptoms and risks.  Hormone replacement therapy may reduce some of these symptoms and risks. Talk to your health care provider about whether hormone replacement therapy is right for you. Follow these instructions at home:  Schedule regular health, dental, and eye exams.  Stay current with your immunizations.  Do not use any tobacco products including cigarettes, chewing tobacco, or electronic cigarettes.  If you are pregnant, do not drink alcohol.  If you are breastfeeding, limit how much and how often you drink alcohol.  Limit alcohol intake to no more than 1 drink per day for nonpregnant women. One drink equals 12 ounces of  beer, 5 ounces of wine, or 1 ounces of hard liquor.  Do not use street drugs.  Do not share needles.  Ask your health care provider for help if you need support or information about quitting drugs.  Tell your health care provider if you often feel depressed.  Tell your health care provider if you have ever been abused or do not feel safe at home. This information is not intended to replace advice given to you by your health care provider. Make sure you discuss any questions you have with your health care provider. Document Released: 02/12/2011 Document Revised: 01/05/2016 Document Reviewed: 05/03/2015 Elsevier Interactive Patient Education  [employer].

## 2018-05-28 NOTE — Progress Notes (Signed)
I acted as a Education administrator for [employer], PA-C Anselmo Pickler, LPN   Subjective:    Claudia Adkins is a 62 y.o. female and is here for a comprehensive physical exam.   HPI  Health Maintenance Due  Topic Date Due  . COLONOSCOPY  08/28/2007  . MAMMOGRAM  06/12/2017    Acute Concerns: R ankle swelling -- no injury, no pain, swelling only on R side. This has been going on since summer. She sometimes wears compression stockings that can help. 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 Denies prior injury to ankle. No redness. Avoids regular salt but states that she does use Himalayan salt on a regular basis.  Chronic Issues: Anemia -- history of this; currently on MVI, unsure if it has iron in it. Denies dizziness, lightheadedness, excessive fatigue. Does not get menses, s/p hysterectomy. Insomnia -- taking 1/4 of trazodone tablet occasionally and gets adequate help with her sleep with this HLD -- LDL cholesterol has been elevated in the past, has been working on diet and exercise  Health Maintenance: Immunizations -- UTD Colonoscopy -- pt had done will obtain report, per pt normal Mammogram -- overdue pt will schedule PAP -- N/A Hysterectomy Bone Density -- N/A Diet -- well balanced Sleep habits -- doing well with sleeping Exercise -- walking a lot more -- tries to walk 30 minutes daily Weight -- Weight: 180 lb 4 oz (81.8 kg)  Mood -- good issues Weight history: Wt Readings from Last 10 Encounters:  05/28/18 180 lb 4 oz (81.8 kg)  12/20/17 186 lb 4 oz (84.5 kg)  06/13/17 185 lb (83.9 kg)  05/24/17 184 lb (83.5 kg)  11/12/16 180 lb (81.6 kg)  02/13/15 184 lb (83.5 kg)  09/30/13 180 lb 12.8 oz (82 kg)   No LMP recorded. Patient has had a hysterectomy. Alcohol use: occasional wine Tobacco use: none  Depression screen PHQ 2/9 05/28/2018  Decreased Interest 0  Down, Depressed, Hopeless 0  PHQ - 2 Score 0   Other  providers/specialists: none  PMHx, SurgHx, SocialHx, Medications, and Allergies were reviewed in the Visit Navigator and updated as appropriate.   Past Medical History:  Diagnosis Date  . Allergy   . Anemia      Past Surgical History:  Procedure Laterality Date  . ABDOMINAL HYSTERECTOMY    . CESAREAN SECTION       Family History  Problem Relation Age of Onset  . Hyperlipidemia Mother   . Hypertension Mother   . Mental illness Mother     Social History   Tobacco Use  . Smoking status: Former Smoker    Packs/day: 0.50    Years: 10.00    Pack years: 5.00    Types: Cigarettes  . Smokeless tobacco: Never Used  . Tobacco comment: quit 30 years ago  Substance Use Topics  . Alcohol use: Yes    Comment: occ  . Drug use: No    Review of Systems:   Review of Systems  Constitutional: Negative.  Negative for chills, fever, malaise/fatigue and weight loss.  HENT: Negative.  Negative for hearing loss, sinus pain and sore throat.   Eyes: Negative.  Negative for blurred vision.  Respiratory: Negative.  Negative for cough and shortness of breath.   Cardiovascular: Negative.  Negative for chest pain, palpitations and leg swelling.  Gastrointestinal: Negative.  Negative for abdominal pain, constipation, diarrhea, heartburn, nausea and vomiting.  Genitourinary: Negative.  Negative  for dysuria, frequency and urgency.  Musculoskeletal: Negative.  Negative for back pain, myalgias and neck pain.       R ankle swelling  Skin: Negative.  Negative for itching and rash.  Neurological: Negative.  Negative for dizziness, tingling, seizures, loss of consciousness and headaches.  Endo/Heme/Allergies: Negative.  Negative for polydipsia.  Psychiatric/Behavioral: Negative.  Negative for depression. The patient is not nervous/anxious.     Objective:   BP 124/80 (BP Location: Left Arm, Patient Position: Sitting, Cuff Size: Normal)   Pulse (!) 59   Temp 98.1 F (36.7 C) (Oral)   Ht 5\' 9"   (1.753 m)   Wt 180 lb 4 oz (81.8 kg)   SpO2 98%   BMI 26.62 kg/m  Body mass index is 26.62 kg/m.   General Appearance:    Alert, cooperative, no distress, appears stated age  Head:    Normocephalic, without obvious abnormality, atraumatic  Eyes:    PERRL, conjunctiva/corneas clear, EOM's intact, fundi    benign, both eyes  Ears:    Normal TM's and external ear canals, both ears  Nose:   Nares normal, septum midline, mucosa normal, no drainage    or sinus tenderness  Throat:   Lips, mucosa, and tongue normal; teeth and gums normal  Neck:   Supple, symmetrical, trachea midline, no adenopathy;    thyroid:  no enlargement/tenderness/nodules; no carotid   bruit or JVD  Back:     Symmetric, no curvature, ROM normal, no CVA tenderness  Lungs:     Clear to auscultation bilaterally, respirations unlabored  Chest Wall:    No tenderness or deformity   Heart:    Regular rate and rhythm, S1 and S2 normal, no murmur, rub   or gallop  Breast Exam:    Deferred  Abdomen:     Soft, non-tender, bowel sounds active all four quadrants,    no masses, no organomegaly  Genitalia:    Deferred  Extremities:   R ankle 1+ swelling diffusely, other extremities normal, atraumatic, no cyanosis or edema No decreased ROM of R ankle No point tenderness of R ankle  Pulses:   2+ and symmetric all extremities  Skin:   Skin color, texture, turgor normal, no rashes or lesions  Lymph nodes:   Cervical, supraclavicular, and axillary nodes normal  Neurologic:   CNII-XII intact, normal strength, sensation and reflexes    throughout    Assessment/Plan:   Claudia Adkins was seen today for annual exam.  Diagnoses and all orders for this visit:  Encounter for general adult medical examination with abnormal findings Today patient counseled on age appropriate routine health concerns for screening and prevention, each reviewed and up to date or declined. Immunizations reviewed and up to date or declined. Labs ordered and  reviewed. Risk factors for depression reviewed and negative. Hearing function and visual acuity are intact. ADLs screened and addressed as needed. Functional ability and level of safety reviewed and appropriate. Education, counseling and referrals performed based on assessed risks today. Patient provided with a copy of personalized plan for preventive services.  Hyperlipidemia, unspecified hyperlipidemia type -     Lipid panel  Anemia, unspecified type Asymptomatic, will update labs. -     CBC with Differential/Platelet  Localized swelling of right foot  Avoid salt. Wear compression stockings as able. Discussed elevating legs as much as able. Discussed more supportive shoes. We discussed that if pain develops or swelling does not improve, that she should let us know and we can pursue further  work-up.  -     CBC with Differential/Platelet -     Comprehensive metabolic panel   Well Adult Exam: Labs ordered: Yes. Patient counseling was done. See below for items discussed. Discussed the patient's BMI.  The BMI BMI is not in the acceptable range; BMI management plan is completed Follow up as needed for acute illness. Breast cancer screening: she is going to schedule. Cervical cancer screening: n/a   Patient Counseling: [x]    Nutrition: Stressed importance of moderation in sodium/caffeine intake, saturated fat and cholesterol, caloric balance, sufficient intake of fresh fruits, vegetables, fiber, calcium, iron, and 1 mg of folate supplement per day (for females capable of pregnancy).  [x]    Stressed the importance of regular exercise.   [x]    Substance Abuse: Discussed cessation/primary prevention of tobacco, alcohol, or other drug use; driving or other dangerous activities under the influence; availability of treatment for abuse.   [x]    Injury prevention: Discussed safety belts, safety helmets, smoke detector, smoking near bedding or upholstery.   [x]    Sexuality: Discussed sexually transmitted  diseases, partner selection, use of condoms, avoidance of unintended pregnancy  and contraceptive alternatives.  [x]    Dental health: Discussed importance of regular tooth brushing, flossing, and dental visits.  [x]    Health maintenance and immunizations reviewed. Please refer to Health maintenance section.   CMA or LPN served as scribe during this visit. History, Physical, and Plan performed by medical provider. The above documentation has been reviewed and is accurate and complete.  Inda Coke, PA-C [place]

## 2018-06-30 ENCOUNTER — Encounter: Payer: Self-pay | Admitting: Physician Assistant

## 2018-07-03 ENCOUNTER — Ambulatory Visit
Admission: RE | Admit: 2018-07-03 | Discharge: 2018-07-03 | Disposition: A | Discharge status: Home / Self Care | Payer: BC Managed Care – PPO | Source: Ambulatory Visit | Attending: Physician Assistant | Admitting: Physician Assistant

## 2018-07-03 DIAGNOSIS — R5381 Other malaise: Secondary | ICD-10-CM

## 2019-06-02 ENCOUNTER — Other Ambulatory Visit: Payer: Self-pay | Admitting: Physician Assistant

## 2019-06-02 DIAGNOSIS — Z1231 Encounter for screening mammogram for malignant neoplasm of breast: Secondary | ICD-10-CM

## 2019-06-26 ENCOUNTER — Other Ambulatory Visit: Payer: Self-pay

## 2019-06-26 ENCOUNTER — Encounter: Payer: Self-pay | Admitting: Physician Assistant

## 2019-06-26 ENCOUNTER — Ambulatory Visit (INDEPENDENT_AMBULATORY_CARE_PROVIDER_SITE_OTHER): Payer: BC Managed Care – PPO | Admitting: Physician Assistant

## 2019-06-26 VITALS — BP 130/84 | HR 64 | Temp 98.0°F | Ht 68.0 in | Wt 191.0 lb

## 2019-06-26 DIAGNOSIS — R350 Frequency of micturition: Secondary | ICD-10-CM | POA: Diagnosis not present

## 2019-06-26 DIAGNOSIS — Z1211 Encounter for screening for malignant neoplasm of colon: Secondary | ICD-10-CM

## 2019-06-26 DIAGNOSIS — Z0001 Encounter for general adult medical examination with abnormal findings: Secondary | ICD-10-CM

## 2019-06-26 DIAGNOSIS — E663 Overweight: Secondary | ICD-10-CM

## 2019-06-26 DIAGNOSIS — E785 Hyperlipidemia, unspecified: Secondary | ICD-10-CM | POA: Diagnosis not present

## 2019-06-26 LAB — COMPREHENSIVE METABOLIC PANEL
ALT: 16 U/L (ref 0–35)
AST: 17 U/L (ref 0–37)
Albumin: 4.1 g/dL (ref 3.5–5.2)
Alkaline Phosphatase: 61 U/L (ref 39–117)
BUN: 19 mg/dL (ref 6–23)
CO2: 27 mEq/L (ref 19–32)
Calcium: 8.8 mg/dL (ref 8.4–10.5)
Chloride: 104 mEq/L (ref 96–112)
Creatinine, Ser: 0.88 mg/dL (ref 0.40–1.20)
GFR: 78.83 mL/min (ref >60.00)
Glucose, Bld: 72 mg/dL (ref 70–99)
Potassium: 3.6 mEq/L (ref 3.5–5.1)
Sodium: 138 mEq/L (ref 135–145)
Total Bilirubin: 0.3 mg/dL (ref 0.2–1.2)
Total Protein: 7 g/dL (ref 6.0–8.3)

## 2019-06-26 LAB — CBC WITH DIFFERENTIAL/PLATELET
Basophils Absolute: 0 10*3/uL (ref 0.0–0.1)
Basophils Relative: 0.5 % (ref 0.0–3.0)
Eosinophils Absolute: 0.2 10*3/uL (ref 0.0–0.7)
Eosinophils Relative: 3 % (ref 0.0–5.0)
HCT: 37.4 % (ref 36.0–46.0)
Hemoglobin: 12.5 g/dL (ref 12.0–15.0)
Lymphocytes Relative: 38.6 % (ref 12.0–46.0)
Lymphs Abs: 2.1 10*3/uL (ref 0.7–4.0)
MCHC: 33.3 g/dL (ref 30.0–36.0)
MCV: 92.7 fl (ref 78.0–100.0)
Monocytes Absolute: 0.4 10*3/uL (ref 0.1–1.0)
Monocytes Relative: 8.2 % (ref 3.0–12.0)
Neutro Abs: 2.7 10*3/uL (ref 1.4–7.7)
Neutrophils Relative %: 49.7 % (ref 43.0–77.0)
Platelets: 194 10*3/uL (ref 150.0–400.0)
RBC: 4.04 Mil/uL (ref 3.87–5.11)
RDW: 12.8 % (ref 11.5–15.5)
WBC: 5.4 10*3/uL (ref 4.0–10.5)

## 2019-06-26 LAB — POCT URINALYSIS DIPSTICK
Bilirubin, UA: NEGATIVE
Blood, UA: NEGATIVE
Glucose, UA: NEGATIVE
Ketones, UA: NEGATIVE
Leukocytes, UA: NEGATIVE
Nitrite, UA: NEGATIVE
Protein, UA: NEGATIVE
Spec Grav, UA: 1.015 (ref 1.010–1.025)
Urobilinogen, UA: 0.2 E.U./dL
pH, UA: 6 (ref 5.0–8.0)

## 2019-06-26 LAB — LIPID PANEL
Cholesterol: 205 mg/dL — ABNORMAL HIGH (ref 0–200)
HDL: 54 mg/dL (ref >39.00)
LDL Cholesterol: 132 mg/dL — ABNORMAL HIGH (ref 0–99)
NonHDL: 151.06
Total CHOL/HDL Ratio: 4
Triglycerides: 93 mg/dL (ref 0.0–149.0)
VLDL: 18.6 mg/dL (ref 0.0–40.0)

## 2019-06-26 NOTE — Patient Instructions (Signed)
It was great to see you!  Please go to the lab for blood work.   Our office will call you with your results unless you have chosen to receive results via MyChart.  If your blood work is normal we will follow-up each year for physicals and as scheduled for chronic medical problems.  If anything is abnormal we will treat accordingly and get you in for a follow-up.  Take care,  Kelda Azad    Health Maintenance, Female Adopting a healthy lifestyle and getting preventive care are important in promoting health and wellness. Ask your health care provider about:  The right schedule for you to have regular tests and exams.  Things you can do on your own to prevent diseases and keep yourself healthy. What should I know about diet, weight, and exercise? Eat a healthy diet   Eat a diet that includes plenty of vegetables, fruits, low-fat dairy products, and lean protein.  Do not eat a lot of foods that are high in solid fats, added sugars, or sodium. Maintain a healthy weight Body mass index (BMI) is used to identify weight problems. It estimates body fat based on height and weight. Your health care provider can help determine your BMI and help you achieve or maintain a healthy weight. Get regular exercise Get regular exercise. This is one of the most important things you can do for your health. Most adults should:  Exercise for at least 150 minutes each week. The exercise should increase your heart rate and make you sweat (moderate-intensity exercise).  Do strengthening exercises at least twice a week. This is in addition to the moderate-intensity exercise.  Spend less time sitting. Even light physical activity can be beneficial. Watch cholesterol and blood lipids Have your blood tested for lipids and cholesterol at 61 years of age, then have this test every 5 years. Have your cholesterol levels checked more often if:  Your lipid or cholesterol levels are high.  You are older than 61  years of age.  You are at high risk for heart disease. What should I know about cancer screening? Depending on your health history and family history, you may need to have cancer screening at various ages. This may include screening for:  Breast cancer.  Cervical cancer.  Colorectal cancer.  Skin cancer.  Lung cancer. What should I know about heart disease, diabetes, and high blood pressure? Blood pressure and heart disease  High blood pressure causes heart disease and increases the risk of stroke. This is more likely to develop in people who have high blood pressure readings, are of African descent, or are overweight.  Have your blood pressure checked: ? Every 3-5 years if you are 18-39 years of age. ? Every year if you are 40 years old or older. Diabetes Have regular diabetes screenings. This checks your fasting blood sugar level. Have the screening done:  Once every three years after age 40 if you are at a normal weight and have a low risk for diabetes.  More often and at a younger age if you are overweight or have a high risk for diabetes. What should I know about preventing infection? Hepatitis B If you have a higher risk for hepatitis B, you should be screened for this virus. Talk with your health care provider to find out if you are at risk for hepatitis B infection. Hepatitis C Testing is recommended for:  Everyone born from 1945 through 1965.  Anyone with known risk factors for hepatitis C. Sexually   transmitted infections (STIs)  Get screened for STIs, including gonorrhea and chlamydia, if: ? You are sexually active and are younger than 61 years of age. ? You are older than 61 years of age and your health care provider tells you that you are at risk for this type of infection. ? Your sexual activity has changed since you were last screened, and you are at increased risk for chlamydia or gonorrhea. Ask your health care provider if you are at risk.  Ask your health  care provider about whether you are at high risk for HIV. Your health care provider may recommend a prescription medicine to help prevent HIV infection. If you choose to take medicine to prevent HIV, you should first get tested for HIV. You should then be tested every 3 months for as long as you are taking the medicine. Pregnancy  If you are about to stop having your period (premenopausal) and you may become pregnant, seek counseling before you get pregnant.  Take 400 to 800 micrograms (mcg) of folic acid every day if you become pregnant.  Ask for birth control (contraception) if you want to prevent pregnancy. Osteoporosis and menopause Osteoporosis is a disease in which the bones lose minerals and strength with aging. This can result in bone fractures. If you are 65 years old or older, or if you are at risk for osteoporosis and fractures, ask your health care provider if you should:  Be screened for bone loss.  Take a calcium or vitamin D supplement to lower your risk of fractures.  Be given hormone replacement therapy (HRT) to treat symptoms of menopause. Follow these instructions at home: Lifestyle  Do not use any products that contain nicotine or tobacco, such as cigarettes, e-cigarettes, and chewing tobacco. If you need help quitting, ask your health care provider.  Do not use street drugs.  Do not share needles.  Ask your health care provider for help if you need support or information about quitting drugs. Alcohol use  Do not drink alcohol if: ? Your health care provider tells you not to drink. ? You are pregnant, may be pregnant, or are planning to become pregnant.  If you drink alcohol: ? Limit how much you use to 0-1 drink a day. ? Limit intake if you are breastfeeding.  Be aware of how much alcohol is in your drink. In the [place] one drink equals one 12 oz bottle of beer (355 mL), one 5 oz glass of wine (148 mL), or one 1 oz glass of hard liquor (44 mL). General  instructions  Schedule regular health, dental, and eye exams.  Stay current with your vaccines.  Tell your health care provider if: ? You often feel depressed. ? You have ever been abused or do not feel safe at home. Summary  Adopting a healthy lifestyle and getting preventive care are important in promoting health and wellness.  Follow your health care provider's instructions about healthy diet, exercising, and getting tested or screened for diseases.  Follow your health care provider's instructions on monitoring your cholesterol and blood pressure. This information is not intended to replace advice given to you by your health care provider. Make sure you discuss any questions you have with your health care provider. Document Released: 02/12/2011 Document Revised: 07/23/2018 Document Reviewed: 07/23/2018 Elsevier Patient Education  2020 Else[employer] Inc[employer]  

## 2019-06-26 NOTE — Progress Notes (Signed)
I acted as a Education administrator for [employer], PA-C Anselmo Pickler, LPN   Subjective:    Claudia Adkins is a 63 y.o. female and is here for a comprehensive physical exam.   HPI  Health Maintenance Due  Topic Date Due  . COLONOSCOPY  08/02/2015    Acute Concerns: Urinary frequency --this has been going on for a few months now.  She states that she noticed after she started taking this herbal supplement with Echinacea and ginger and lemon.  She denies dysuria or changes in color of urine.  Denies fever, chills, back pain, hematuria.  Chronic Issues: Overweight --she has been dealing with increase in snacking at work due to stress.  She has gained about 10 pounds over the past year.  She is starting to increase her exercise. Hyperlipidemia -- does have history this but is not taking anything. Last LDL was 119. Due for recheck.  Health Maintenance: Immunizations -- UTD Colonoscopy -- overdue, last done 07/2012 repeat 3 yrs. Mammogram -- scheduled 07/24/19 PAP -- N/A Hysterectomy Bone Density -- None Diet --has good options when not at work, does snack on baked goods during work Caffeine intake --as needed Sleep habits --overall has no issues, does not take trazodone hardly ever Exercise --walks in the morning Weight -- Weight: 191 lb (86.6 kg)  Body mass index is 29.04 kg/m.  Mood -- no issues Weight history: Wt Readings from Last 10 Encounters:  06/26/19 191 lb (86.6 kg)  05/28/18 180 lb 4 oz (81.8 kg)  12/20/17 186 lb 4 oz (84.5 kg)  06/13/17 185 lb (83.9 kg)  05/24/17 184 lb (83.5 kg)  11/12/16 180 lb (81.6 kg)  02/13/15 184 lb (83.5 kg)  09/30/13 180 lb 12.8 oz (82 kg)   No LMP recorded. Patient has had a hysterectomy. Period characteristics: denies vaginal bleeding Alcohol use: none excessive Tobacco use: none  Depression screen PHQ 2/9 06/26/2019  Decreased Interest 0  Down, Depressed, Hopeless 0  PHQ - 2 Score 0     Other providers/specialists: Patient  Care Team: Inda Coke, [place] as PCP - General (Physician Assistant)    PMHx, SurgHx, SocialHx, Medications, and Allergies were reviewed in the Visit Navigator and updated as appropriate.   Past Medical History:  Diagnosis Date  . Allergy   . Anemia      Past Surgical History:  Procedure Laterality Date  . ABDOMINAL HYSTERECTOMY    . CESAREAN SECTION       Family History  Problem Relation Age of Onset  . Hyperlipidemia Mother   . Hypertension Mother   . Mental illness Mother   . Breast cancer Neg Hx     Social History   Tobacco Use  . Smoking status: Former Smoker    Packs/day: 0.50    Years: 10.00    Pack years: 5.00    Types: Cigarettes  . Smokeless tobacco: Never Used  . Tobacco comment: quit 30 years ago  Substance Use Topics  . Alcohol use: Yes    Comment: occ  . Drug use: No    Review of Systems:   Review of Systems  Constitutional: Negative for chills, fever, malaise/fatigue and weight loss.  HENT: Negative for hearing loss, sinus pain and sore throat.   Respiratory: Negative for cough and hemoptysis.   Cardiovascular: Negative for chest pain, palpitations, leg swelling and PND.  Gastrointestinal: Negative for abdominal pain, constipation, diarrhea, heartburn, nausea and vomiting.  Genitourinary: Positive for frequency. Negative for dysuria and urgency.  Musculoskeletal: Negative for back pain, myalgias and neck pain.  Skin: Negative for itching and rash.  Neurological: Negative for dizziness, tingling, seizures and headaches.  Endo/Heme/Allergies: Negative for polydipsia.  Psychiatric/Behavioral: Negative for depression. The patient is not nervous/anxious.     Objective:   BP 130/84 (BP Location: Left Arm, Patient Position: Sitting, Cuff Size: Normal)   Pulse 64   Temp 98 F (36.7 C) (Temporal)   Ht 5\' 8"  (1.727 m)   Wt 191 lb (86.6 kg)   SpO2 97%   BMI 29.04 kg/m  Body mass index is 29.04 kg/m.   General Appearance:    Alert,  cooperative, no distress, appears stated age  Head:    Normocephalic, without obvious abnormality, atraumatic  Eyes:    PERRL, conjunctiva/corneas clear, EOM's intact, fundi    benign, both eyes  Ears:    Normal TM's and external ear canals, both ears  Nose:   Nares normal, septum midline, mucosa normal, no drainage    or sinus tenderness  Throat:   Lips, mucosa, and tongue normal; teeth and gums normal  Neck:   Supple, symmetrical, trachea midline, no adenopathy;    thyroid:  no enlargement/tenderness/nodules; no carotid   bruit or JVD  Back:     Symmetric, no curvature, ROM normal, no CVA tenderness  Lungs:     Clear to auscultation bilaterally, respirations unlabored  Chest Wall:    No tenderness or deformity   Heart:    Regular rate and rhythm, S1 and S2 normal, no murmur, rub or gallop  Breast Exam:    Deferred  Abdomen:     Soft, non-tender, bowel sounds active all four quadrants,    no masses, no organomegaly  Genitalia:    Deferred  Extremities:   Extremities normal, atraumatic, no cyanosis or edema  Pulses:   2+ and symmetric all extremities  Skin:   Skin color, texture, turgor normal, no rashes or lesions  Lymph nodes:   Cervical, supraclavicular, and axillary nodes normal  Neurologic:   CNII-XII intact, normal strength, sensation and reflexes    throughout     Assessment/Plan:   Claudia Adkins was seen today for annual exam.  Diagnoses and all orders for this visit:  Abnormal physical examination Today patient counseled on age appropriate routine health concerns for screening and prevention, each reviewed and up to date or declined. Immunizations reviewed and up to date or declined. Labs ordered and reviewed. Risk factors for depression reviewed and negative. Hearing function and visual acuity are intact. ADLs screened and addressed as needed. Functional ability and level of safety reviewed and appropriate. Education, counseling and referrals performed based on assessed risks  today. Patient provided with a copy of personalized plan for preventive services. -     CBC with Differential/Platelet -     Comprehensive metabolic panel  Hyperlipidemia, unspecified hyperlipidemia type  -     Lipid panel  Urinary frequency  I think this may be related to her supplements that she is taking.  I recommended that she hold them to see if this will help.  UA negative.  Follow-up if symptoms worsen or persist. -     POCT Urinalysis Dipstick  Overweight Discussed the need to stop the snacking on sweets while at work.  She is going to work on this.  Continue efforts and exercise.  Special screening for malignant neoplasms, colon -     REFERRAL TO GI COLONOSCOPY    Well Adult Exam: Labs ordered: Yes. Patient counseling  was done. See below for items discussed. Discussed the patient's BMI.  The BMI is not in the acceptable range; BMI management plan is completed Follow up as needed for acute illness. Breast cancer screening: scheduled. Cervical cancer screening: n/a   Patient Counseling: [x]    Nutrition: Stressed importance of moderation in sodium/caffeine intake, saturated fat and cholesterol, caloric balance, sufficient intake of fresh fruits, vegetables, fiber, calcium, iron, and 1 mg of folate supplement per day (for females capable of pregnancy).  [x]    Stressed the importance of regular exercise.   [x]    Substance Abuse: Discussed cessation/primary prevention of tobacco, alcohol, or other drug use; driving or other dangerous activities under the influence; availability of treatment for abuse.   [x]    Injury prevention: Discussed safety belts, safety helmets, smoke detector, smoking near bedding or upholstery.   [x]    Sexuality: Discussed sexually transmitted diseases, partner selection, use of condoms, avoidance of unintended pregnancy  and contraceptive alternatives.  [x]    Dental health: Discussed importance of regular tooth brushing, flossing, and dental visits.  [x]     Health maintenance and immunizations reviewed. Please refer to Health maintenance section.   CMA or LPN served as scribe during this visit. History, Physical, and Plan performed by medical provider. The above documentation has been reviewed and is accurate and complete.  Inda Coke, PA-C [place]

## 2019-07-24 ENCOUNTER — Ambulatory Visit
Admission: RE | Admit: 2019-07-24 | Discharge: 2019-07-24 | Disposition: A | Discharge status: Home / Self Care | Payer: BC Managed Care – PPO | Source: Ambulatory Visit | Attending: Physician Assistant | Admitting: Physician Assistant

## 2019-07-24 ENCOUNTER — Other Ambulatory Visit: Payer: Self-pay

## 2019-07-24 DIAGNOSIS — Z1231 Encounter for screening mammogram for malignant neoplasm of breast: Secondary | ICD-10-CM

## 2019-07-27 ENCOUNTER — Encounter: Payer: Self-pay | Admitting: Physician Assistant

## 2019-07-27 ENCOUNTER — Other Ambulatory Visit: Payer: Self-pay | Admitting: Physician Assistant

## 2019-07-27 DIAGNOSIS — R928 Other abnormal and inconclusive findings on diagnostic imaging of breast: Secondary | ICD-10-CM

## 2019-07-30 ENCOUNTER — Other Ambulatory Visit: Payer: Self-pay

## 2019-07-30 ENCOUNTER — Ambulatory Visit: Payer: BC Managed Care – PPO

## 2019-07-30 ENCOUNTER — Ambulatory Visit
Admission: RE | Admit: 2019-07-30 | Discharge: 2019-07-30 | Disposition: A | Discharge status: Home / Self Care | Payer: BC Managed Care – PPO | Source: Ambulatory Visit | Attending: Physician Assistant | Admitting: Physician Assistant

## 2019-07-30 DIAGNOSIS — R928 Other abnormal and inconclusive findings on diagnostic imaging of breast: Secondary | ICD-10-CM

## 2020-04-05 ENCOUNTER — Telehealth: Payer: Self-pay

## 2020-04-05 NOTE — Telephone Encounter (Signed)
Pt states that [facility] recommended a dentist to her during her last CPE. She is asking for the name of that dentist.

## 2020-04-05 NOTE — Telephone Encounter (Signed)
Mychart message sent to pt with info

## 2020-05-05 ENCOUNTER — Telehealth: Payer: Self-pay

## 2020-05-05 NOTE — Telephone Encounter (Signed)
Spoke to Claudia Adkins told her that is not a normal reaction from COVID vaccine. Aldona Bar is out of the office today and we have no availability in our office I would suggest you go to Urgent care to be evaluated.  Claudia Adkins said she does not want to go to Urgent care asked when next available appt is? Told Claudia Adkins I have a 9:00 AM on Monday. Claudia Adkins said okay I will take it. Asked Claudia Adkins if any history or blood clots? Claudia Adkins said no. Told Claudia Adkins to monitor symptoms any redness, swelling or increase in pain need to go to Urgent Care. Claudia Adkins verbalized understanding.

## 2020-05-05 NOTE — Telephone Encounter (Signed)
Pt is experiencing fatigue and muscle pain in her right thigh. She received the covid vaccine on the 16th. Pt has had to call out of work the past 2 days. She would like to know if this is a normal reaction to the covid vaccine, or if these symptoms would be coming from something else. Pt goes into work in an hour and states if she does not pick up the phone to leave a voicemail.

## 2020-05-09 ENCOUNTER — Ambulatory Visit: Payer: BC Managed Care – PPO | Admitting: Physician Assistant

## 2020-05-11 ENCOUNTER — Other Ambulatory Visit: Payer: Self-pay

## 2020-05-11 ENCOUNTER — Telehealth: Payer: Self-pay | Admitting: *Deleted

## 2020-05-11 ENCOUNTER — Ambulatory Visit (HOSPITAL_COMMUNITY)
Admission: RE | Admit: 2020-05-11 | Discharge: 2020-05-11 | Disposition: A | Discharge status: Home / Self Care | Payer: BC Managed Care – PPO | Source: Ambulatory Visit | Attending: Physician Assistant | Admitting: Physician Assistant

## 2020-05-11 ENCOUNTER — Ambulatory Visit (INDEPENDENT_AMBULATORY_CARE_PROVIDER_SITE_OTHER): Payer: BC Managed Care – PPO | Admitting: Physician Assistant

## 2020-05-11 ENCOUNTER — Encounter: Payer: Self-pay | Admitting: Physician Assistant

## 2020-05-11 VITALS — BP 130/70 | HR 65 | Temp 97.8°F | Ht 68.0 in | Wt 183.4 lb

## 2020-05-11 DIAGNOSIS — R5383 Other fatigue: Secondary | ICD-10-CM

## 2020-05-11 DIAGNOSIS — M79604 Pain in right leg: Secondary | ICD-10-CM | POA: Diagnosis present

## 2020-05-11 MED ORDER — TRAZODONE HCL 50 MG PO TABS: 50.0000 mg | ORAL_TABLET | Freq: Every evening | ORAL | 1 refills | Status: DC | PRN

## 2020-05-11 NOTE — Progress Notes (Signed)
Claudia Adkins is a 64 y.o. female here for a new problem.  I acted as a Education administrator for [employer], PA-C [employer], LPN   History of Present Illness:   Chief Complaint  Patient presents with   Fatigue   thigh pain    HPI   Fatigue Pt c/o fatigue after her shot like she was going to pass out. Fatigue lasted for 4 days. Resolved after 4 days. Denies: chest pain, SOB, palpitations, syncope. Does need refill on her Trazodone.  Thigh pain Pt had COVID vaccine on 9/16. On that evening she started having pain in her right thigh off and on. It was a 9/10 dull ache. With time, has decreased to a 3/10. Denies redness, swelling, skin sensitivity, reproduction of symptoms with palpation/movement, family or personal history of bone cancer. Denies sedentary lifesyle.   Past Medical History:  Diagnosis Date   Allergy    Anemia      Social History   Tobacco Use   Smoking status: Former Smoker    Packs/day: 0.50    Years: 10.00    Pack years: 5.00    Types: Cigarettes   Smokeless tobacco: Never Used   Tobacco comment: quit 30 years ago  Vaping Use   Vaping Use: Never used  Substance Use Topics   Alcohol use: Yes    Comment: occ   Drug use: No    Past Surgical History:  Procedure Laterality Date   ABDOMINAL HYSTERECTOMY     CESAREAN SECTION      Family History  Problem Relation Age of Onset   Hyperlipidemia Mother    Hypertension Mother    Mental illness Mother    Breast cancer Neg Hx     Allergies  Allergen Reactions   Sulfa Antibiotics Other (See Comments)    Bad body aches    Current Medications:   Current Outpatient Medications:    ibuprofen (ADVIL,MOTRIN) 200 MG tablet, Take 600 mg by mouth as needed., Disp: , Rfl:    Multiple Vitamin (MULTIVITAMIN) tablet, Take 1 tablet by mouth daily., Disp: , Rfl:    traZODone (DESYREL) 50 MG tablet, Take 1 tablet (50 mg total) by mouth at bedtime as needed., Disp: 90 tablet, Rfl: 1   Review  of Systems:   ROS  Negative unless otherwise specified per HPI.  Vitals:   Vitals:   05/11/20 1039  BP: 130/70  Pulse: 65  Temp: 97.8 F (36.6 C)  TempSrc: Temporal  SpO2: 98%  Weight: 183 lb 6.1 oz (83.2 kg)  Height: 5\' 8"  (1.727 m)     Body mass index is 27.88 kg/m.  Physical Exam:   Physical Exam Vitals and nursing note reviewed.  Constitutional:      General: She is not in acute distress.    Appearance: She is well-developed. She is not ill-appearing or toxic-appearing.  Cardiovascular:     Rate and Rhythm: Normal rate and regular rhythm.     Pulses: Normal pulses.          Dorsalis pedis pulses are 2+ on the right side and 2+ on the left side.     Heart sounds: Normal heart sounds, S1 normal and S2 normal.     Comments: No LE edema Pulmonary:     Effort: Pulmonary effort is normal.     Breath sounds: Normal breath sounds.  Musculoskeletal:     Comments: Normal ROM of RLE No obvious swelling No calf pain  Skin:    General: Skin  is warm and dry.  Neurological:     Mental Status: She is alert.     GCS: GCS eye subscore is 4. GCS verbal subscore is 5. GCS motor subscore is 6.  Psychiatric:        Speech: Speech normal.        Behavior: Behavior normal. Behavior is cooperative.      Assessment and Plan:   Claudia Adkins was seen today for fatigue and thigh pain.  Diagnoses and all orders for this visit:  Right leg pain Possible side effect of vaccine? U/S to r/o blood clot. Will consider upper thigh xray if symptoms persist beyond 1-2 more weeks, or if worsen in the intermittent. Update labs today to further work-up - -including CBC, CMP, CK. -     VAS [place] LOWER EXTREMITY VENOUS (DVT); Future -     CBC with Differential/Platelet; Future -     Comprehensive metabolic panel; Future -     CK (Creatine Kinase); Future  Fatigue, unspecified type Symptoms have resolved.  Will update trazodone refill. Update labs today to determine any other further possible  cause of fatigue. Follow-up if symptoms return.  Other orders -     traZODone (DESYREL) 50 MG tablet; Take 1 tablet (50 mg total) by mouth at bedtime as needed.  CMA or LPN served as scribe during this visit. History, Physical, and Plan performed by medical provider. The above documentation has been reviewed and is accurate and complete.  Inda Coke, PA-C

## 2020-05-11 NOTE — Patient Instructions (Signed)
It was great to see you!  We are updating your labs today.  We will get an ultrasound of your calf to make sure there is no clot.  If your thigh pain does not continue to improve or if you have ongoing symptoms after 1-2 more weeks, lets get an xray. Call me and we will get this scheduled.  Trazodone has been refilled.  Take care,  Inda Coke PA-C

## 2020-05-11 NOTE — Telephone Encounter (Signed)
Please see message. °

## 2020-05-11 NOTE — Telephone Encounter (Signed)
Megan from [facility] calling with report on pt. Pt is Negative for DVT in Right leg. Told her thank you.

## 2020-05-12 LAB — CBC WITH DIFFERENTIAL/PLATELET
Absolute Monocytes: 465 cells/uL (ref 200–950)
Basophils Absolute: 28 cells/uL (ref 0–200)
Basophils Relative: 0.6 %
Eosinophils Absolute: 169 cells/uL (ref 15–500)
Eosinophils Relative: 3.6 %
HCT: 35.6 % (ref 35.0–45.0)
Hemoglobin: 11.9 g/dL (ref 11.7–15.5)
Lymphs Abs: 1744 cells/uL (ref 850–3900)
MCH: 30.3 pg (ref 27.0–33.0)
MCHC: 33.4 g/dL (ref 32.0–36.0)
MCV: 90.6 fL (ref 80.0–100.0)
MPV: 11.6 fL (ref 7.5–12.5)
Monocytes Relative: 9.9 %
Neutro Abs: 2294 cells/uL (ref 1500–7800)
Neutrophils Relative %: 48.8 %
Platelets: 192 10*3/uL (ref 140–400)
RBC: 3.93 10*6/uL (ref 3.80–5.10)
RDW: 12.2 % (ref 11.0–15.0)
Total Lymphocyte: 37.1 %
WBC: 4.7 10*3/uL (ref 3.8–10.8)

## 2020-05-12 LAB — COMPREHENSIVE METABOLIC PANEL
AG Ratio: 1.5 (calc) (ref 1.0–2.5)
ALT: 12 U/L (ref 6–29)
AST: 13 U/L (ref 10–35)
Albumin: 4 g/dL (ref 3.6–5.1)
Alkaline phosphatase (APISO): 65 U/L (ref 37–153)
BUN: 15 mg/dL (ref 7–25)
CO2: 26 mmol/L (ref 20–32)
Calcium: 9 mg/dL (ref 8.6–10.4)
Chloride: 108 mmol/L (ref 98–110)
Creat: 0.94 mg/dL (ref 0.50–0.99)
Globulin: 2.7 g/dL (calc) (ref 1.9–3.7)
Glucose, Bld: 72 mg/dL (ref 65–99)
Potassium: 3.8 mmol/L (ref 3.5–5.3)
Sodium: 142 mmol/L (ref 135–146)
Total Bilirubin: 0.3 mg/dL (ref 0.2–1.2)
Total Protein: 6.7 g/dL (ref 6.1–8.1)

## 2020-05-12 LAB — CK: Total CK: 193 U/L — ABNORMAL HIGH (ref 29–143)

## 2020-06-27 ENCOUNTER — Encounter: Payer: BC Managed Care – PPO | Admitting: Physician Assistant

## 2020-06-27 NOTE — Progress Notes (Deleted)
I acted as a Education administrator for [employer], PA-C Anselmo Pickler, LPN     Subjective:    Claudia Adkins is a 64 y.o. female and is here for a comprehensive physical exam.   HPI  Health Maintenance Due  Topic Date Due  . COLONOSCOPY  08/02/2015  . INFLUENZA VACCINE  03/13/2020    Acute Concerns:   Chronic Issues:   Health Maintenance: Immunizations -- UTD Colonoscopy -- overdue Mammogram -- UTD, due 07/2020 PAP -- N/A Bone Density --  Diet -- *** Caffeine intake -- *** Sleep habits -- *** Exercise -- *** Weight --    Mood -- *** Weight history: Wt Readings from Last 10 Encounters:  05/11/20 183 lb 6.1 oz (83.2 kg)  06/26/19 191 lb (86.6 kg)  05/28/18 180 lb 4 oz (81.8 kg)  12/20/17 186 lb 4 oz (84.5 kg)  06/13/17 185 lb (83.9 kg)  05/24/17 184 lb (83.5 kg)  11/12/16 180 lb (81.6 kg)  02/13/15 184 lb (83.5 kg)  09/30/13 180 lb 12.8 oz (82 kg)   There is no height or weight on file to calculate BMI. No LMP recorded. Patient has had a hysterectomy. Period characteristics: Alcohol use: Tobacco use:  Depression screen PHQ 2/9 06/26/2019  Decreased Interest 0  Down, Depressed, Hopeless 0  PHQ - 2 Score 0     Other providers/specialists: Patient Care Team: Inda Coke, [place] as PCP - General (Physician Assistant)    PMHx, SurgHx, SocialHx, Medications, and Allergies were reviewed in the Visit Navigator and updated as appropriate.   Past Medical History:  Diagnosis Date  . Allergy   . Anemia     Past Surgical History:  Procedure Laterality Date  . ABDOMINAL HYSTERECTOMY    . CESAREAN SECTION      Family History  Problem Relation Age of Onset  . Hyperlipidemia Mother   . Hypertension Mother   . Mental illness Mother   . Breast cancer Neg Hx     Social History   Tobacco Use  . Smoking status: Former Smoker    Packs/day: 0.50    Years: 10.00    Pack years: 5.00    Types: Cigarettes  . Smokeless tobacco: Never Used  . Tobacco  comment: quit 30 years ago  Vaping Use  . Vaping Use: Never used  Substance Use Topics  . Alcohol use: Yes    Comment: occ  . Drug use: No    Review of Systems:   ROS  Objective:   There were no vitals taken for this visit. There is no height or weight on file to calculate BMI.   General Appearance:    Alert, cooperative, no distress, appears stated age  Head:    Normocephalic, without obvious abnormality, atraumatic  Eyes:    PERRL, conjunctiva/corneas clear, EOM's intact, fundi    benign, both eyes  Ears:    Normal TM's and external ear canals, both ears  Nose:   Nares normal, septum midline, mucosa normal, no drainage    or sinus tenderness  Throat:   Lips, mucosa, and tongue normal; teeth and gums normal  Neck:   Supple, symmetrical, trachea midline, no adenopathy;    thyroid:  no enlargement/tenderness/nodules; no carotid   bruit or JVD  Back:     Symmetric, no curvature, ROM normal, no CVA tenderness  Lungs:     Clear to auscultation bilaterally, respirations unlabored  Chest Wall:    No tenderness or deformity   Heart:  Regular rate and rhythm, S1 and S2 normal, no murmur, rub or gallop  Breast Exam:    No tenderness, masses, or nipple abnormality  Abdomen:     Soft, non-tender, bowel sounds active all four quadrants,    no masses, no organomegaly  Genitalia:    Normal female without lesion, discharge or tenderness  Extremities:   Extremities normal, atraumatic, no cyanosis or edema  Pulses:   2+ and symmetric all extremities  Skin:   Skin color, texture, turgor normal, no rashes or lesions  Lymph nodes:   Cervical, supraclavicular, and axillary nodes normal  Neurologic:   CNII-XII intact, normal strength, sensation and reflexes    throughout   Results for orders placed or performed in visit on 05/11/20  Comprehensive metabolic panel  Result Value Ref Range   Glucose, Bld 72 65 - 99 mg/dL   BUN 15 7 - 25 mg/dL   Creat 0.94 0.50 - 0.99 mg/dL   BUN/Creatinine  Ratio NOT APPLICABLE 6 - 22 (calc)   Sodium 142 135 - 146 mmol/L   Potassium 3.8 3.5 - 5.3 mmol/L   Chloride 108 98 - 110 mmol/L   CO2 26 20 - 32 mmol/L   Calcium 9.0 8.6 - 10.4 mg/dL   Total Protein 6.7 6.1 - 8.1 g/dL   Albumin 4.0 3.6 - 5.1 g/dL   Globulin 2.7 1.9 - 3.7 g/dL (calc)   AG Ratio 1.5 1.0 - 2.5 (calc)   Total Bilirubin 0.3 0.2 - 1.2 mg/dL   Alkaline phosphatase (APISO) 65 37 - 153 U/L   AST 13 10 - 35 U/L   ALT 12 6 - 29 U/L  CBC with Differential/Platelet  Result Value Ref Range   WBC 4.7 3.8 - 10.8 Thousand/uL   RBC 3.93 3.80 - 5.10 Million/uL   Hemoglobin 11.9 11.7 - 15.5 g/dL   HCT 35.6 35 - 45 %   MCV 90.6 80.0 - 100.0 fL   MCH 30.3 27.0 - 33.0 pg   MCHC 33.4 32.0 - 36.0 g/dL   RDW 12.2 11.0 - 15.0 %   Platelets 192 140 - 400 Thousand/uL   MPV 11.6 7.5 - 12.5 fL   Neutro Abs 2,294 1,500 - 7,800 cells/uL   Lymphs Abs 1,744 850 - 3,900 cells/uL   Absolute Monocytes 465 200 - 950 cells/uL   Eosinophils Absolute 169 15.0 - 500.0 cells/uL   Basophils Absolute 28 0.0 - 200.0 cells/uL   Neutrophils Relative % 48.8 %   Total Lymphocyte 37.1 %   Monocytes Relative 9.9 %   Eosinophils Relative 3.6 %   Basophils Relative 0.6 %  CK (Creatine Kinase)  Result Value Ref Range   Total CK 193 (H) 29.0 - 143.0 U/L    Assessment/Plan:   There are no diagnoses linked to this encounter.   Well Adult Exam: Labs ordered: {Yes/No:18319::"Yes"}. Patient counseling was done. See below for items discussed. Discussed the patient's BMI.  The {BMI plan (MU NQF measure 421):19504} Follow up {follow-up interval:13814}. Breast cancer screening: ***. Cervical cancer screening: ***   Patient Counseling: [x]    Nutrition: Stressed importance of moderation in sodium/caffeine intake, saturated fat and cholesterol, caloric balance, sufficient intake of fresh fruits, vegetables, fiber, calcium, iron, and 1 mg of folate supplement per day (for females capable of pregnancy).  [x]    Stressed  the importance of regular exercise.   [x]    Substance Abuse: Discussed cessation/primary prevention of tobacco, alcohol, or other drug use; driving or other dangerous activities under  the influence; availability of treatment for abuse.   [x]    Injury prevention: Discussed safety belts, safety helmets, smoke detector, smoking near bedding or upholstery.   [x]    Sexuality: Discussed sexually transmitted diseases, partner selection, use of condoms, avoidance of unintended pregnancy  and contraceptive alternatives.  [x]    Dental health: Discussed importance of regular tooth brushing, flossing, and dental visits.  [x]    Health maintenance and immunizations reviewed. Please refer to Health maintenance section.   ***  Inda Coke, PA-C [place]

## 2020-06-29 ENCOUNTER — Other Ambulatory Visit: Payer: Self-pay | Admitting: Physician Assistant

## 2020-06-29 DIAGNOSIS — Z1231 Encounter for screening mammogram for malignant neoplasm of breast: Secondary | ICD-10-CM

## 2020-07-01 ENCOUNTER — Ambulatory Visit (INDEPENDENT_AMBULATORY_CARE_PROVIDER_SITE_OTHER): Payer: BC Managed Care – PPO | Admitting: Physician Assistant

## 2020-07-01 ENCOUNTER — Encounter: Payer: Self-pay | Admitting: Physician Assistant

## 2020-07-01 ENCOUNTER — Other Ambulatory Visit: Payer: Self-pay

## 2020-07-01 VITALS — BP 130/90 | HR 60 | Temp 97.1°F | Ht 68.5 in | Wt 180.0 lb

## 2020-07-01 DIAGNOSIS — R5383 Other fatigue: Secondary | ICD-10-CM

## 2020-07-01 DIAGNOSIS — M8580 Other specified disorders of bone density and structure, unspecified site: Secondary | ICD-10-CM

## 2020-07-01 DIAGNOSIS — E663 Overweight: Secondary | ICD-10-CM | POA: Diagnosis not present

## 2020-07-01 DIAGNOSIS — Z1211 Encounter for screening for malignant neoplasm of colon: Secondary | ICD-10-CM

## 2020-07-01 DIAGNOSIS — E785 Hyperlipidemia, unspecified: Secondary | ICD-10-CM | POA: Insufficient documentation

## 2020-07-01 DIAGNOSIS — Z0000 Encounter for general adult medical examination without abnormal findings: Secondary | ICD-10-CM

## 2020-07-01 DIAGNOSIS — E559 Vitamin D deficiency, unspecified: Secondary | ICD-10-CM

## 2020-07-01 NOTE — Progress Notes (Signed)
I acted as a Education administrator for [employer], PA-C Anselmo Pickler, LPN    Subjective:    Claudia Adkins is a 64 y.o. female and is here for a comprehensive physical exam.   HPI  Health Maintenance Due  Topic Date Due  . DEXA SCAN  Never done  . COLONOSCOPY  08/02/2015    Acute Concerns: Fatigue -- reports that she is overall doing well from her COVID vaccine, but is interested in having her B12 checked. Has friends that get IV infusions of vitamins and she is wondering if she needs this. Denies paresthesias, rectal bleeding, concerns for sleep apnea, unintentional weight loss. She is a shift worker and takes trazodone regularly.   Chronic Issues: Osteopenia -- takes a regular MVI daily that has an additional calcium and vitamin D supplement in it. States that she has prior DEXA with osteopenia -- I do not have this record. Vit D deficiency -- hx of this in the past, would like labs updated. HLD -- hx of this; no medications.  Health Maintenance: Immunizations -- UTD Colonoscopy -- overdue Mammogram -- UTD, scheduled for Dec. PAP -- N/A Bone Density -- due Diet -- eats overall healthy diet Sleep habits -- works night shift, but gets good sleep on off days; takes trazodone when sleeping prior to work shifts Exercise -- working on getting back into activity Weight -- Weight: 180 lb (81.6 kg)  Mood -- overall stable Weight history: Wt Readings from Last 10 Encounters:  07/01/20 180 lb (81.6 kg)  05/11/20 183 lb 6.1 oz (83.2 kg)  06/26/19 191 lb (86.6 kg)  05/28/18 180 lb 4 oz (81.8 kg)  12/20/17 186 lb 4 oz (84.5 kg)  06/13/17 185 lb (83.9 kg)  05/24/17 184 lb (83.5 kg)  11/12/16 180 lb (81.6 kg)  02/13/15 184 lb (83.5 kg)  09/30/13 180 lb 12.8 oz (82 kg)   Body mass index is 26.97 kg/m. No LMP recorded. Patient has had a hysterectomy. Period characteristics: none, denies vaginal bleeding Alcohol use: very rare  Tobacco use: 5 pack years, >30 years ago  Depression  screen PHQ 2/9 07/01/2020  Decreased Interest 0  Down, Depressed, Hopeless 0  PHQ - 2 Score 0     Other providers/specialists: Patient Care Team: Inda Coke, [place] as PCP - General (Physician Assistant)    PMHx, SurgHx, SocialHx, Medications, and Allergies were reviewed in the Visit Navigator and updated as appropriate.   Past Medical History:  Diagnosis Date  . Allergy   . Anemia      Past Surgical History:  Procedure Laterality Date  . ABDOMINAL HYSTERECTOMY    . CESAREAN SECTION       Family History  Problem Relation Age of Onset  . Hyperlipidemia Mother   . Hypertension Mother   . Mental illness Mother   . Breast cancer Neg Hx     Social History   Tobacco Use  . Smoking status: Former Smoker    Packs/day: 0.50    Years: 10.00    Pack years: 5.00    Types: Cigarettes  . Smokeless tobacco: Never Used  . Tobacco comment: quit 30 years ago  Vaping Use  . Vaping Use: Never used  Substance Use Topics  . Alcohol use: Yes    Comment: occ  . Drug use: No    Review of Systems:   Review of Systems  Constitutional: Negative for chills, fever, malaise/fatigue and weight loss.  HENT: Negative for hearing loss, sinus pain and  sore throat.   Eyes: Negative for blurred vision.  Respiratory: Negative for cough and shortness of breath.   Cardiovascular: Negative for chest pain, palpitations and leg swelling.  Gastrointestinal: Negative for abdominal pain, constipation, diarrhea, heartburn, nausea and vomiting.  Genitourinary: Negative for dysuria, frequency and urgency.  Musculoskeletal: Negative for back pain, myalgias and neck pain.  Skin: Negative for itching and rash.  Neurological: Negative for dizziness, tingling, seizures, loss of consciousness and headaches.  Endo/Heme/Allergies: Negative for polydipsia.  Psychiatric/Behavioral: Negative for depression. The patient is not nervous/anxious.   All other systems reviewed and are negative.   Objective:     BP 130/90 (BP Location: Left Arm, Patient Position: Sitting, Cuff Size: Normal)   Pulse 60   Temp (!) 97.1 F (36.2 C) (Temporal)   Ht 5' 8.5" (1.74 m)   Wt 180 lb (81.6 kg)   SpO2 96%   BMI 26.97 kg/m  Body mass index is 26.97 kg/m.   General Appearance:    Alert, cooperative, no distress, appears stated age  Head:    Normocephalic, without obvious abnormality, atraumatic  Eyes:    PERRL, conjunctiva/corneas clear, EOM's intact, fundi    benign, both eyes  Ears:    Normal TM's and external ear canals, both ears  Nose:   Nares normal, septum midline, mucosa normal, no drainage    or sinus tenderness  Throat:   Lips, mucosa, and tongue normal; teeth and gums normal  Neck:   Supple, symmetrical, trachea midline, no adenopathy;    thyroid:  no enlargement/tenderness/nodules; no carotid   bruit or JVD  Back:     Symmetric, no curvature, ROM normal, no CVA tenderness  Lungs:     Clear to auscultation bilaterally, respirations unlabored  Chest Wall:    No tenderness or deformity   Heart:    Regular rate and rhythm, S1 and S2 normal, no murmur, rub or gallop  Breast Exam:    No tenderness, masses, or nipple abnormality  Abdomen:     Soft, non-tender, bowel sounds active all four quadrants,    no masses, no organomegaly  Genitalia:    Deferred  Extremities:   Extremities normal, atraumatic, no cyanosis or edema  Pulses:   2+ and symmetric all extremities  Skin:   Skin color, texture, turgor normal, no rashes or lesions  Lymph nodes:   Cervical, supraclavicular, and axillary nodes normal  Neurologic:   CNII-XII intact, normal strength, sensation and reflexes    throughout    Assessment/Plan:   Claudia Adkins was seen today for annual exam.  Diagnoses and all orders for this visit:  Routine physical examination Today patient counseled on age appropriate routine health concerns for screening and prevention, each reviewed and up to date or declined. Immunizations reviewed and up to date  or declined. Labs ordered and reviewed. Risk factors for depression reviewed and negative. Hearing function and visual acuity are intact. ADLs screened and addressed as needed. Functional ability and level of safety reviewed and appropriate. Education, counseling and referrals performed based on assessed risks today. Patient provided with a copy of personalized plan for preventive services.  Counseled on getting covid booster.  Hyperlipidemia, unspecified hyperlipidemia type Update lipid panel and provide recommendations accordingly. -     Lipid panel; Future -     Lipid panel  Overweight Continue to work on diet and exercise. -     CBC with Differential/Platelet; Future -     Comprehensive metabolic panel; Future -  Comprehensive metabolic panel -     CBC with Differential/Platelet  Special screening for malignant neoplasms, colon -     Cologuard  Fatigue, unspecified type Overall improved. Will check B12 per patient request. Continue use of trazodone when needed. Follow-up if any worsening symptoms or other concerns. -     Vitamin B12; Future -     Vitamin B12  Osteopenia, unspecified location Update DEXA. -     DG Bone Density; Future  Vitamin D deficiency Update Vitamin D level today and provide supplementation recommendations accordingly. -     VITAMIN D 25 Hydroxy (Vit-D Deficiency, Fractures); Future -     VITAMIN D 25 Hydroxy (Vit-D Deficiency, Fractures)    Well Adult Exam: Labs ordered: Yes. Patient counseling was done. See below for items discussed. Discussed the patient's BMI.  The BMI is not in the acceptable range; BMI management plan is completed Follow up in one year. Breast cancer screening: scheduled. Cervical cancer screening: n/a   Patient Counseling: [x]    Nutrition: Stressed importance of moderation in sodium/caffeine intake, saturated fat and cholesterol, caloric balance, sufficient intake of fresh fruits, vegetables, fiber, calcium, iron, and 1 mg of  folate supplement per day (for females capable of pregnancy).  [x]    Stressed the importance of regular exercise.   [x]    Substance Abuse: Discussed cessation/primary prevention of tobacco, alcohol, or other drug use; driving or other dangerous activities under the influence; availability of treatment for abuse.   [x]    Injury prevention: Discussed safety belts, safety helmets, smoke detector, smoking near bedding or upholstery.   [x]    Sexuality: Discussed sexually transmitted diseases, partner selection, use of condoms, avoidance of unintended pregnancy  and contraceptive alternatives.  [x]    Dental health: Discussed importance of regular tooth brushing, flossing, and dental visits.  [x]    Health maintenance and immunizations reviewed. Please refer to Health maintenance section.   CMA or LPN served as scribe during this visit. History, Physical, and Plan performed by medical provider. The above documentation has been reviewed and is accurate and complete.  Inda Coke, PA-C [place]

## 2020-07-01 NOTE — Patient Instructions (Signed)
It was great to see you!  To-Do: --Labs today --Schedule your DEXA on the way out --Cologuard will reach out to you about scheduling your test --Get your booster  Please go to the lab for blood work.   Our office will call you with your results unless you have chosen to receive results via MyChart.  If your blood work is normal we will follow-up each year for physicals and as scheduled for chronic medical problems.  If anything is abnormal we will treat accordingly and get you in for a follow-up.  Take care,  [facility] Maintenance, Female Adopting a healthy lifestyle and getting preventive care are important in promoting health and wellness. Ask your health care provider about:  The right schedule for you to have regular tests and exams.  Things you can do on your own to prevent diseases and keep yourself healthy. What should I know about diet, weight, and exercise? Eat a healthy diet   Eat a diet that includes plenty of vegetables, fruits, low-fat dairy products, and lean protein.  Do not eat a lot of foods that are high in solid fats, added sugars, or sodium. Maintain a healthy weight Body mass index (BMI) is used to identify weight problems. It estimates body fat based on height and weight. Your health care provider can help determine your BMI and help you achieve or maintain a healthy weight. Get regular exercise Get regular exercise. This is one of the most important things you can do for your health. Most adults should:  Exercise for at least 150 minutes each week. The exercise should increase your heart rate and make you sweat (moderate-intensity exercise).  Do strengthening exercises at least twice a week. This is in addition to the moderate-intensity exercise.  Spend less time sitting. Even light physical activity can be beneficial. Watch cholesterol and blood lipids Have your blood tested for lipids and cholesterol at 62 years of age, then have this  test every 5 years. Have your cholesterol levels checked more often if:  Your lipid or cholesterol levels are high.  You are older than 62 years of age.  You are at high risk for heart disease. What should I know about cancer screening? Depending on your health history and family history, you may need to have cancer screening at various ages. This may include screening for:  Breast cancer.  Cervical cancer.  Colorectal cancer.  Skin cancer.  Lung cancer. What should I know about heart disease, diabetes, and high blood pressure? Blood pressure and heart disease  High blood pressure causes heart disease and increases the risk of stroke. This is more likely to develop in people who have high blood pressure readings, are of African descent, or are overweight.  Have your blood pressure checked: ? Every 3-5 years if you are 82-46 years of age. ? Every year if you are 57 years old or older. Diabetes Have regular diabetes screenings. This checks your fasting blood sugar level. Have the screening done:  Once every three years after age 45 if you are at a normal weight and have a low risk for diabetes.  More often and at a younger age if you are overweight or have a high risk for diabetes. What should I know about preventing infection? Hepatitis B If you have a higher risk for hepatitis B, you should be screened for this virus. Talk with your health care provider to find out if you are at risk for hepatitis B  infection. Hepatitis C Testing is recommended for:  Everyone born from [birth year] through 1963.  Anyone with known risk factors for hepatitis C. Sexually transmitted infections (STIs)  Get screened for STIs, including gonorrhea and chlamydia, if: ? You are sexually active and are younger than 62 years of age. ? You are older than 62 years of age and your health care provider tells you that you are at risk for this type of infection. ? Your sexual activity has changed since you  were last screened, and you are at increased risk for chlamydia or gonorrhea. Ask your health care provider if you are at risk.  Ask your health care provider about whether you are at high risk for HIV. Your health care provider may recommend a prescription medicine to help prevent HIV infection. If you choose to take medicine to prevent HIV, you should first get tested for HIV. You should then be tested every 3 months for as long as you are taking the medicine. Pregnancy  If you are about to stop having your period (premenopausal) and you may become pregnant, seek counseling before you get pregnant.  Take 400 to 800 micrograms (mcg) of folic acid every day if you become pregnant.  Ask for birth control (contraception) if you want to prevent pregnancy. Osteoporosis and menopause Osteoporosis is a disease in which the bones lose minerals and strength with aging. This can result in bone fractures. If you are 32 years old or older, or if you are at risk for osteoporosis and fractures, ask your health care provider if you should:  Be screened for bone loss.  Take a calcium or vitamin D supplement to lower your risk of fractures.  Be given hormone replacement therapy (HRT) to treat symptoms of menopause. Follow these instructions at home: Lifestyle  Do not use any products that contain nicotine or tobacco, such as cigarettes, e-cigarettes, and chewing tobacco. If you need help quitting, ask your health care provider.  Do not use street drugs.  Do not share needles.  Ask your health care provider for help if you need support or information about quitting drugs. Alcohol use  Do not drink alcohol if: ? Your health care provider tells you not to drink. ? You are pregnant, may be pregnant, or are planning to become pregnant.  If you drink alcohol: ? Limit how much you use to 0-1 drink a day. ? Limit intake if you are breastfeeding.  Be aware of how much alcohol is in your drink. In the  [place] one drink equals one 12 oz bottle of beer (355 mL), one 5 oz glass of wine (148 mL), or one 1 oz glass of hard liquor (44 mL). General instructions  Schedule regular health, dental, and eye exams.  Stay current with your vaccines.  Tell your health care provider if: ? You often feel depressed. ? You have ever been abused or do not feel safe at home. Summary  Adopting a healthy lifestyle and getting preventive care are important in promoting health and wellness.  Follow your health care provider's instructions about healthy diet, exercising, and getting tested or screened for diseases.  Follow your health care provider's instructions on monitoring your cholesterol and blood pressure. This information is not intended to replace advice given to you by your health care provider. Make sure you discuss any questions you have with your health care provider. Document Revised: 07/23/2018 Document Reviewed: 07/23/2018 Elsevier Patient Education  2020 [employer].

## 2020-07-02 LAB — CBC WITH DIFFERENTIAL/PLATELET
Absolute Monocytes: 348 cells/uL (ref 200–950)
Basophils Absolute: 22 cells/uL (ref 0–200)
Basophils Relative: 0.5 %
Eosinophils Absolute: 129 cells/uL (ref 15–500)
Eosinophils Relative: 3 %
HCT: 37.3 % (ref 35.0–45.0)
Hemoglobin: 12.3 g/dL (ref 11.7–15.5)
Lymphs Abs: 1578 cells/uL (ref 850–3900)
MCH: 29.2 pg (ref 27.0–33.0)
MCHC: 33 g/dL (ref 32.0–36.0)
MCV: 88.6 fL (ref 80.0–100.0)
MPV: 11.4 fL (ref 7.5–12.5)
Monocytes Relative: 8.1 %
Neutro Abs: 2223 cells/uL (ref 1500–7800)
Neutrophils Relative %: 51.7 %
Platelets: 209 10*3/uL (ref 140–400)
RBC: 4.21 10*6/uL (ref 3.80–5.10)
RDW: 12.3 % (ref 11.0–15.0)
Total Lymphocyte: 36.7 %
WBC: 4.3 10*3/uL (ref 3.8–10.8)

## 2020-07-02 LAB — COMPREHENSIVE METABOLIC PANEL
AG Ratio: 1.5 (calc) (ref 1.0–2.5)
ALT: 17 U/L (ref 6–29)
AST: 15 U/L (ref 10–35)
Albumin: 4.2 g/dL (ref 3.6–5.1)
Alkaline phosphatase (APISO): 61 U/L (ref 37–153)
BUN: 22 mg/dL (ref 7–25)
CO2: 26 mmol/L (ref 20–32)
Calcium: 9.3 mg/dL (ref 8.6–10.4)
Chloride: 107 mmol/L (ref 98–110)
Creat: 0.99 mg/dL (ref 0.50–0.99)
Globulin: 2.8 g/dL (calc) (ref 1.9–3.7)
Glucose, Bld: 93 mg/dL (ref 65–99)
Potassium: 4.4 mmol/L (ref 3.5–5.3)
Sodium: 141 mmol/L (ref 135–146)
Total Bilirubin: 0.4 mg/dL (ref 0.2–1.2)
Total Protein: 7 g/dL (ref 6.1–8.1)

## 2020-07-02 LAB — LIPID PANEL
Cholesterol: 213 mg/dL — ABNORMAL HIGH (ref <200)
HDL: 58 mg/dL (ref >=50)
LDL Cholesterol (Calc): 141 mg/dL (calc) — ABNORMAL HIGH
Non-HDL Cholesterol (Calc): 155 mg/dL (calc) — ABNORMAL HIGH (ref <130)
Total CHOL/HDL Ratio: 3.7 (calc) (ref <5.0)
Triglycerides: 58 mg/dL (ref <150)

## 2020-07-02 LAB — VITAMIN D 25 HYDROXY (VIT D DEFICIENCY, FRACTURES): Vit D, 25-Hydroxy: 23 ng/mL — ABNORMAL LOW (ref 30–100)

## 2020-07-02 LAB — VITAMIN B12: Vitamin B-12: 421 pg/mL (ref 200–1100)

## 2020-07-04 ENCOUNTER — Ambulatory Visit (INDEPENDENT_AMBULATORY_CARE_PROVIDER_SITE_OTHER)
Admission: RE | Admit: 2020-07-04 | Discharge: 2020-07-04 | Disposition: A | Discharge status: Home / Self Care | Payer: BC Managed Care – PPO | Source: Ambulatory Visit | Attending: Physician Assistant | Admitting: Physician Assistant

## 2020-07-04 ENCOUNTER — Other Ambulatory Visit: Payer: Self-pay

## 2020-07-04 DIAGNOSIS — M8580 Other specified disorders of bone density and structure, unspecified site: Secondary | ICD-10-CM | POA: Diagnosis not present

## 2020-07-09 DIAGNOSIS — M8580 Other specified disorders of bone density and structure, unspecified site: Secondary | ICD-10-CM | POA: Diagnosis not present

## 2020-07-11 LAB — COLOGUARD: Cologuard: POSITIVE — AB

## 2020-07-19 ENCOUNTER — Telehealth: Payer: Self-pay | Admitting: Physician Assistant

## 2020-07-19 ENCOUNTER — Encounter: Payer: Self-pay | Admitting: Physician Assistant

## 2020-07-19 DIAGNOSIS — R195 Other fecal abnormalities: Secondary | ICD-10-CM

## 2020-07-19 NOTE — Telephone Encounter (Signed)
Spoke to pt told her per Aldona Bar,  that her cologuard test was positive. She needs to follow-up with gastroenterology to discuss proceeding with a colonosocpy for further evaluation. Pt verbalized understanding and is agreeable to referral. Told pt will place referral and someone will contact her to schedule appt. Pt verbalized understanding. Referral placed.

## 2020-07-19 NOTE — Addendum Note (Signed)
Addended by: Marian Sorrow on: 07/19/2020 04:51 PM   Modules accepted: Orders

## 2020-07-19 NOTE — Telephone Encounter (Signed)
Please call patient and let her know that her cologuard test was positive.  She needs to follow-up with gastroenterology to discuss proceeding with a colonosocpy for further evaluation.  Please place referral for patient if she is agreeable.  Inda Coke PA-C

## 2020-07-20 ENCOUNTER — Telehealth: Payer: Self-pay

## 2020-07-20 NOTE — Telephone Encounter (Signed)
Abnormal colo-guard results    Results  has been faxed over from [facility]

## 2020-07-20 NOTE — Telephone Encounter (Signed)
Pt already aware and GI referral has been placed.

## 2020-07-26 ENCOUNTER — Other Ambulatory Visit: Payer: Self-pay

## 2020-07-26 ENCOUNTER — Ambulatory Visit
Admission: RE | Admit: 2020-07-26 | Discharge: 2020-07-26 | Disposition: A | Discharge status: Home / Self Care | Payer: BC Managed Care – PPO | Source: Ambulatory Visit | Attending: Physician Assistant | Admitting: Physician Assistant

## 2020-07-26 DIAGNOSIS — Z1231 Encounter for screening mammogram for malignant neoplasm of breast: Secondary | ICD-10-CM

## 2020-07-26 NOTE — Telephone Encounter (Signed)
Patient is calling in asking for a call back, she said she received the news that her cologuard results were positive, but was advised to contact someone and could not remember the name of person.

## 2020-07-29 NOTE — Telephone Encounter (Signed)
LM notifying pt that the referral was placed to [facility] and they will contact her to schedule an appointment

## 2020-08-03 ENCOUNTER — Encounter: Payer: Self-pay | Admitting: Gastroenterology

## 2020-08-26 ENCOUNTER — Ambulatory Visit (INDEPENDENT_AMBULATORY_CARE_PROVIDER_SITE_OTHER): Payer: BC Managed Care – PPO | Admitting: Gastroenterology

## 2020-08-26 ENCOUNTER — Encounter: Payer: Self-pay | Admitting: Gastroenterology

## 2020-08-26 VITALS — BP 148/80 | HR 71 | Ht 68.5 in | Wt 180.0 lb

## 2020-08-26 DIAGNOSIS — R195 Other fecal abnormalities: Secondary | ICD-10-CM | POA: Diagnosis not present

## 2020-08-26 MED ORDER — SUTAB 1479-225-188 MG PO TABS: 1.0000 | ORAL_TABLET | ORAL | 0 refills | Status: DC

## 2020-08-26 NOTE — Progress Notes (Signed)
     08/26/2020 [name] [id number] 24-Apr-1956   HISTORY OF PRESENT ILLNESS: This is a 65 year old female who is new to our office.  She is here today to discuss colonoscopy for evaluation of positive Cologuard results.  She had a colonoscopy by Dr. Collene Mares in 2013 at which time she was found to have 2 polyps removed that were hyperplastic on pathology.  There was a lot of stool throughout the colon on her exam and she recommended a repeat in 3 years.  The patient recently had a Cologuard performed that was ordered by her PCP and results were positive.  She denies seeing blood in her stools or any black stools.  She states she moves her bowels regularly.  She gets an intermittent dull ache in her right lower quadrant at times, but otherwise has no complaints.   She was referred here by Inda Coke, PA-C, for evaluation regarding the positive Cologuard study.  Past Medical History:  Diagnosis Date  . Allergy   . Anemia    Past Surgical History:  Procedure Laterality Date  . ABDOMINAL HYSTERECTOMY    . CESAREAN SECTION      reports that she has quit smoking. Her smoking use included cigarettes. She has a 5.00 pack-year smoking history. She has never used smokeless tobacco. She reports current alcohol use. She reports that she does not use drugs. family history includes Hyperlipidemia in her mother; Hypertension in her mother; Mental illness in her mother. Allergies  Allergen Reactions  . Sulfa Antibiotics Other (See Comments)    Bad body aches  . Sulfamethoxazole-Trimethoprim Rash      Outpatient Encounter Medications as of 08/26/2020  Medication Sig  . Multiple Vitamin (MULTIVITAMIN) tablet Take 1 tablet by mouth daily.  . traZODone (DESYREL) 50 MG tablet Take 1 tablet (50 mg total) by mouth at bedtime as needed.  . [DISCONTINUED] ibuprofen (ADVIL,MOTRIN) 200 MG tablet Take 600 mg by mouth as needed. (Patient not taking: Reported on 08/26/2020)   No facility-administered  encounter medications on file as of 08/26/2020.     REVIEW OF SYSTEMS  : All other systems reviewed and negative except where noted in the History of Present Illness.   PHYSICAL EXAM: BP (!) 148/80   Pulse 71   Ht 5' 8.5" (1.74 m)   Wt 180 lb (81.6 kg)   SpO2 98%   BMI 26.97 kg/m  General: Well developed AA female in no acute distress Head: Normocephalic and atraumatic Eyes:  Sclerae anicteric, conjunctiva pink. Ears: Normal auditory acuity Lungs: Clear throughout to auscultation; no W/R/R. Heart: Regular rate and rhythm; no M/R/G. Abdomen: Soft, non-distended.  BS present.  Non-tender. Rectal:  Will be done at the time of colonoscopy. Musculoskeletal: Symmetrical with no gross deformities  Skin: No lesions on visible extremities Extremities: No edema  Neurological: Alert oriented x 4, grossly non-focal Psychological:  Alert and cooperative. Normal mood and affect  ASSESSMENT AND PLAN: *Positive Cologuard: She had colonoscopy in 2013 by Dr. Collene Mares and had a couple of hyperplastic polyps removed.  She denies seeing blood in her stool.  No GI complaints.  We will plan for colonoscopy with Dr. Hilarie Fredrickson.  The risks, benefits, and alternatives to colonoscopy were discussed with the patient and she consents to proceed.   CC:  Inda Coke, [place]

## 2020-08-26 NOTE — Progress Notes (Signed)
Addendum: Reviewed and agree with assessment and management plan. Pyrtle, Jay M, MD  

## 2020-08-26 NOTE — Patient Instructions (Signed)
If you are age 63 or older, your body mass index should be between 23-30. Your Body mass index is 26.97 kg/m. If this is out of the aforementioned range listed, please consider follow up with your Primary Care Provider.  If you are age 29 or younger, your body mass index should be between 19-25. Your Body mass index is 26.97 kg/m. If this is out of the aformentioned range listed, please consider follow up with your Primary Care Provider.   You have been scheduled for a colonoscopy. Please follow written instructions given to you at your visit today.  Please pick up your prep supplies at the pharmacy within the next 1-3 days. If you use inhalers (even only as needed), please bring them with you on the day of your procedure.  Follow up pending the results of your Colonoscopy or as needed.  Thank you for entrusting me with your care and choosing [facility].  Alonza Bogus, PA-C

## 2020-09-19 ENCOUNTER — Other Ambulatory Visit: Payer: Self-pay

## 2020-09-19 ENCOUNTER — Ambulatory Visit (AMBULATORY_SURGERY_CENTER): Payer: BC Managed Care – PPO | Admitting: Internal Medicine

## 2020-09-19 ENCOUNTER — Encounter: Payer: Self-pay | Admitting: Internal Medicine

## 2020-09-19 ENCOUNTER — Encounter: Payer: BC Managed Care – PPO | Admitting: Internal Medicine

## 2020-09-19 VITALS — BP 114/73 | HR 53 | Temp 97.8°F | Resp 19 | Ht 68.5 in | Wt 180.0 lb

## 2020-09-19 DIAGNOSIS — D120 Benign neoplasm of cecum: Secondary | ICD-10-CM | POA: Diagnosis not present

## 2020-09-19 DIAGNOSIS — D123 Benign neoplasm of transverse colon: Secondary | ICD-10-CM | POA: Diagnosis not present

## 2020-09-19 DIAGNOSIS — R195 Other fecal abnormalities: Secondary | ICD-10-CM | POA: Diagnosis present

## 2020-09-19 DIAGNOSIS — K635 Polyp of colon: Secondary | ICD-10-CM | POA: Diagnosis not present

## 2020-09-19 MED ORDER — SODIUM CHLORIDE 0.9 % IV SOLN: 500.0000 mL | INTRAVENOUS | Status: DC

## 2020-09-19 NOTE — Progress Notes (Signed)
Called to room to assist during endoscopic procedure.  Patient ID and intended procedure confirmed with present staff. Received instructions for my participation in the procedure from the performing physician.  

## 2020-09-19 NOTE — Patient Instructions (Signed)
YOU HAD AN ENDOSCOPIC PROCEDURE TODAY AT THE [facility]:   Refer to the procedure report that was given to you for any specific questions about what was found during the examination.  If the procedure report does not answer your questions, please call your gastroenterologist to clarify.  If you requested that your care partner not be given the details of your procedure findings, then the procedure report has been included in a sealed envelope for you to review at your convenience later.  YOU SHOULD EXPECT: Some feelings of bloating in the abdomen. Passage of more gas than usual.  Walking can help get rid of the air that was put into your GI tract during the procedure and reduce the bloating. If you had a lower endoscopy (such as a colonoscopy or flexible sigmoidoscopy) you may notice spotting of blood in your stool or on the toilet paper. If you underwent a bowel prep for your procedure, you may not have a normal bowel movement for a few days.  Please Note:  You might notice some irritation and congestion in your nose or some drainage.  This is from the oxygen used during your procedure.  There is no need for concern and it should clear up in a day or so.  SYMPTOMS TO REPORT IMMEDIATELY:   Following lower endoscopy (colonoscopy or flexible sigmoidoscopy):  Excessive amounts of blood in the stool  Significant tenderness or worsening of abdominal pains  Swelling of the abdomen that is new, acute  Fever of 100F or higher  For urgent or emergent issues, a gastroenterologist can be reached at any hour by calling [phone]. Do not use MyChart messaging for urgent concerns.    DIET:  We do recommend a small meal at first, but then you may proceed to your regular diet.  Drink plenty of fluids but you should avoid alcoholic beverages for 24 hours.  ACTIVITY:  You should plan to take it easy for the rest of today and you should NOT DRIVE or use heavy machinery until tomorrow (because  of the sedation medicines used during the test).    FOLLOW UP: Our staff will call the number listed on your records 48-72 hours following your procedure to check on you and address any questions or concerns that you may have regarding the information given to you following your procedure. If we do not reach you, we will leave a message.  We will attempt to reach you two times.  During this call, we will ask if you have developed any symptoms of COVID 19. If you develop any symptoms (ie: fever, flu-like symptoms, shortness of breath, cough etc.) before then, please call [phone].  If you test positive for Covid 19 in the 2 weeks post procedure, please call and report this information to us.    If any biopsies were taken you will be contacted by phone or by letter within the next 1-3 weeks.  Please call us at [phone] if you have not heard about the biopsies in 3 weeks.    SIGNATURES/CONFIDENTIALITY: You and/or your care partner have signed paperwork which will be entered into your electronic medical record.  These signatures attest to the fact that that the information above on your After Visit Summary has been reviewed and is understood.  Full responsibility of the confidentiality of this discharge information lies with you and/or your care-partner. 

## 2020-09-19 NOTE — Op Note (Signed)
[facility] Patient Name: Claudia Adkins Procedure Date: 09/19/2020 1:50 PM MRN: [id number] Endoscopist: Beverley Fiedler , MD Age: 65 Referring MD:  Date of Birth: 1956-07-18 Gender: Female Account #: [id number] Procedure:                Colonoscopy Indications:              Positive Cologuard test; previous colonoscopy Dec                            2013 with Dr. Loreta Ave (2 hyperplastic rectal polyps) Medicines:                Monitored Anesthesia Care Procedure:                Pre-Anesthesia Assessment:                           - Prior to the procedure, a History and Physical                            was performed, and patient medications and                            allergies were reviewed. The patient's tolerance of                            previous anesthesia was also reviewed. The risks                            and benefits of the procedure and the sedation                            options and risks were discussed with the patient.                            All questions were answered, and informed consent                            was obtained. Prior Anticoagulants: The patient has                            taken no previous anticoagulant or antiplatelet                            agents. ASA Grade Assessment: II - A patient with                            mild systemic disease. After reviewing the risks                            and benefits, the patient was deemed in                            satisfactory condition to undergo the procedure.  After obtaining informed consent, the colonoscope                            was passed under direct vision. Throughout the                            procedure, the patient's blood pressure, pulse, and                            oxygen saturations were monitored continuously. The                            Olympus PCF-H190DL (#[id number]) Colonoscope was                            introduced  through the anus and advanced to the                            cecum, identified by appendiceal orifice and                            ileocecal valve. The colonoscopy was performed                            without difficulty. The patient tolerated the                            procedure well. The quality of the bowel                            preparation was good. The ileocecal valve,                            appendiceal orifice, and rectum were photographed. Scope In: 2:33:06 PM Scope Out: 2:53:48 PM Scope Withdrawal Time: 0 hours 16 minutes 3 seconds  Total Procedure Duration: 0 hours 20 minutes 42 seconds  Findings:                 The digital rectal exam was normal.                           A 3 mm polyp was found in the cecum. The polyp was                            sessile. The polyp was removed with a cold snare.                            Resection and retrieval were complete.                           Two sessile polyps were found in the proximal                            transverse colon. The polyps were 3 to 6  mm in                            size. These polyps were removed with a cold snare.                            Resection and retrieval were complete.                           The exam was otherwise without abnormality on                            direct and retroflexion views. Complications:            No immediate complications. Estimated Blood Loss:     Estimated blood loss was minimal. Impression:               - One 3 mm polyp in the cecum, removed with a cold                            snare. Resected and retrieved.                           - Two 3 to 6 mm polyps in the proximal transverse                            colon, removed with a cold snare. Resected and                            retrieved.                           - The examination was otherwise normal on direct                            and retroflexion views. Recommendation:           -  Patient has a contact number available for                            emergencies. The signs and symptoms of potential                            delayed complications were discussed with the                            patient. Return to normal activities tomorrow.                            Written discharge instructions were provided to the                            patient.                           - Resume previous diet.                           -  Continue present medications.                           - Await pathology results.                           - Repeat colonoscopy is recommended. The                            colonoscopy date will be determined after pathology                            results from today's exam become available for                            review. Beverley Fiedler, MD 09/19/2020 2:55:32 PM This report has been signed electronically.

## 2020-09-19 NOTE — Progress Notes (Signed)
Pt's states no medical or surgical changes since previsit or office visit. 

## 2020-09-21 ENCOUNTER — Telehealth: Payer: Self-pay | Admitting: *Deleted

## 2020-09-21 NOTE — Telephone Encounter (Signed)
  Follow up Call-  Call back number 09/19/2020  Post procedure Call Back phone  # Claudia Adkins  Permission to leave phone message Yes  Some recent data might be hidden     Patient questions:  Do you have a fever, pain , or abdominal swelling? No. Pain Score  0 *  Have you tolerated food without any problems? Yes.    Have you been able to return to your normal activities? Yes.    Do you have any questions about your discharge instructions: Diet   No. Medications  No. Follow up visit  No.  Do you have questions or concerns about your Care? No.  Actions: * If pain score is 4 or above: No action needed, pain <4.  1. Have you developed a fever since your procedure? no  2.   Have you had an respiratory symptoms (SOB or cough) since your procedure? no  3.   Have you tested positive for COVID 19 since your procedure no  4.   Have you had any family members/close contacts diagnosed with the COVID 19 since your procedure?  no   If yes to any of these questions please route to Joylene John, RN and Joella Prince, RN

## 2020-09-28 ENCOUNTER — Encounter: Payer: Self-pay | Admitting: Internal Medicine

## 2020-12-05 ENCOUNTER — Telehealth (INDEPENDENT_AMBULATORY_CARE_PROVIDER_SITE_OTHER): Payer: BC Managed Care – PPO | Admitting: Family Medicine

## 2020-12-05 ENCOUNTER — Telehealth: Payer: Self-pay

## 2020-12-05 ENCOUNTER — Encounter: Payer: Self-pay | Admitting: Family Medicine

## 2020-12-05 DIAGNOSIS — Z889 Allergy status to unspecified drugs, medicaments and biological substances status: Secondary | ICD-10-CM | POA: Diagnosis not present

## 2020-12-05 DIAGNOSIS — J3489 Other specified disorders of nose and nasal sinuses: Secondary | ICD-10-CM | POA: Diagnosis not present

## 2020-12-05 DIAGNOSIS — R6889 Other general symptoms and signs: Secondary | ICD-10-CM

## 2020-12-05 DIAGNOSIS — R067 Sneezing: Secondary | ICD-10-CM

## 2020-12-05 DIAGNOSIS — Z7189 Other specified counseling: Secondary | ICD-10-CM

## 2020-12-05 NOTE — Telephone Encounter (Signed)
Please call pt and schedule a virtual visit for tomorrow. Claudia Adkins is not available today.

## 2020-12-05 NOTE — Telephone Encounter (Signed)
Patient didn't want to wait and see [facility] tomorrow. Scheduled appt with Dr. Elease Hashimoto for today.

## 2020-12-05 NOTE — Progress Notes (Signed)
Virtual Visit via Video Note  I connected with Claudia Adkins on 12/05/20 at 11:30 AM EDT by a video enabled telemedicine application 2/2 [id number] pandemic and verified that I am speaking with the correct person using two identifiers.  Location patient: home Location provider:work or home office Persons participating in the virtual visit: patient, provider  I discussed the limitations of evaluation and management by telemedicine and the availability of in person appointments. The patient expressed understanding and agreed to proceed.   HPI: Pt with sneezing, rhinorrhea, itchy water eyes x 2 wks.  Pt tried Claritin, Allegra, Zyrtec x 2 wks.  Endorses h/o allergy issues each year but worse this yr.  Pt denies HA , fever, ear pain/pressure, sore throat, cough, diarrhea, sick contacts.  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  ROS: See pertinent positives and negatives per HPI.  Past Medical History:  Diagnosis Date  . Allergy   . Anemia     Past Surgical History:  Procedure Laterality Date  . ABDOMINAL HYSTERECTOMY    . CESAREAN SECTION      Family History  Problem Relation Age of Onset  . Hyperlipidemia Mother   . Hypertension Mother   . Mental illness Mother   . Breast cancer Neg Hx   . Colon cancer Neg Hx   . Pancreatic cancer Neg Hx   . Esophageal cancer Neg Hx   . Colon polyps Neg Hx   . Rectal cancer Neg Hx   . Stomach cancer Neg Hx      Current Outpatient Medications:  Marland Kitchen  Multiple Vitamin (MULTIVITAMIN) tablet, Take 1 tablet by mouth daily., Disp: , Rfl:  .  traZODone (DESYREL) 50 MG tablet, Take 1 tablet (50 mg total) by mouth at bedtime as needed., Disp: 90 tablet, Rfl: 1  EXAM:  VITALS per patient if applicable: RR between 82-99 bpm  GENERAL: alert, oriented, appears well and in no acute distress  HEENT: atraumatic, conjunctiva clear, no obvious abnormalities on inspection of external nose and ears  NECK: normal movements of the head and neck  LUNGS: on  inspection no signs of respiratory distress, breathing rate appears normal, no obvious gross SOB, gasping or wheezing  CV: no obvious cyanosis  MS: moves all visible extremities without noticeable abnormality  PSYCH/NEURO: pleasant and cooperative, no obvious depression or anxiety, speech and thought processing grossly intact  ASSESSMENT AND PLAN:  Discussed the following assessment and plan:  Rhinorrhea  Sneezing  Itchy eyes  History of seasonal allergies  Educated about COVID-19 virus infection  -discussed sxs likely 2/2 seasonal allergies but must also consider COVID-19 -educated on s/s of COVID-19 -Pt to try Xyzal and flonase.  Declined rx, will obtain OTC. -consider local honey, allergy eye gtts, and saline nasal rinse -consider COVID testing.  Given info on area testing locations -f/u with pcp for worsened or continued sxs.   I discussed the assessment and treatment plan with the patient. The patient was provided an opportunity to ask questions and all were answered. The patient agreed with the plan and demonstrated an understanding of the instructions.   The patient was advised to call back or seek an in-person evaluation if the symptoms worsen or if the condition fails to improve as anticipated.  Billie Ruddy, MD

## 2020-12-05 NOTE — Telephone Encounter (Signed)
Patient stated that her allergies are horrible and would like to see if Aldona Bar would send in eye drops and some allergies medication. Told patient we would more than likely need appt she said she rather send a message that Aldona Bar has done it before.  Patient would like the medications sent to :  [place], [place] - [address] Phone:  [phone]  Fax:  [phone]

## 2020-12-06 ENCOUNTER — Telehealth: Payer: BC Managed Care – PPO | Admitting: Family Medicine

## 2021-02-10 ENCOUNTER — Ambulatory Visit: Payer: BC Managed Care – PPO | Admitting: Physician Assistant

## 2021-02-10 ENCOUNTER — Encounter: Payer: Self-pay | Admitting: Physician Assistant

## 2021-02-10 ENCOUNTER — Other Ambulatory Visit: Payer: Self-pay

## 2021-02-10 VITALS — BP 146/75 | HR 68 | Temp 98.3°F | Ht 68.5 in | Wt 168.2 lb

## 2021-02-10 DIAGNOSIS — R3589 Other polyuria: Secondary | ICD-10-CM

## 2021-02-10 DIAGNOSIS — R634 Abnormal weight loss: Secondary | ICD-10-CM | POA: Diagnosis not present

## 2021-02-10 DIAGNOSIS — R682 Dry mouth, unspecified: Secondary | ICD-10-CM | POA: Diagnosis not present

## 2021-02-10 LAB — POCT GLYCOSYLATED HEMOGLOBIN (HGB A1C): Hemoglobin A1C: 5.6 % (ref 4.0–5.6)

## 2021-02-10 NOTE — Progress Notes (Signed)
Acute Office Visit  Subjective:    Patient ID: Claudia Adkins, female    DOB: [birth year], 65 y.o.   MRN: [id number]  Chief Complaint  Patient presents with   Urinary Frequency   Weight Loss   Dry Mouth    HPI Patient is in today for multiple symptoms. Notes in the last month she has been experiencing urinary frequency, dry mouth, increase in salivary production, and unintentional weight loss of about 20 lbs. States this has gradually come on over the last month. Denies any recent illness. States her appetite has been fine. Denies any hx of cancer.  She only takes a MultiVitamin and Trazodone as needed. 09/19/20 - Colonoscopy done after Cologuard+, polyps benign. She has not had COVID-19 illness that she knows of. Walks about 2 miles daily and takes care of herself per patient. No significant family history.     Wt Readings from Last 3 Encounters:  02/10/21 168 lb 3.2 oz (76.3 kg)  09/19/20 180 lb (81.6 kg)  08/26/20 180 lb (81.6 kg)   BP Readings from Last 3 Encounters:  02/10/21 (!) 146/75  09/19/20 114/73  08/26/20 (!) 148/80     Past Medical History:  Diagnosis Date   Allergy    Anemia     Past Surgical History:  Procedure Laterality Date   ABDOMINAL HYSTERECTOMY     CESAREAN SECTION      Family History  Problem Relation Age of Onset   Hyperlipidemia Mother    Hypertension Mother    Mental illness Mother    Breast cancer Neg Hx    Colon cancer Neg Hx    Pancreatic cancer Neg Hx    Esophageal cancer Neg Hx    Colon polyps Neg Hx    Rectal cancer Neg Hx    Stomach cancer Neg Hx     Social History   Socioeconomic History   Marital status: Widowed    Spouse name: Not on file   Number of children: Not on file   Years of education: Not on file   Highest education level: Not on file  Occupational History   Not on file  Tobacco Use   Smoking status: Former    Packs/day: 0.50    Years: 10.00    Pack years: 5.00    Types: Cigarettes   Smokeless  tobacco: Never   Tobacco comments:    quit 30 years ago  Vaping Use   Vaping Use: Never used  Substance and Sexual Activity   Alcohol use: Yes    Comment: occ   Drug use: No   Sexual activity: Never  Other Topics Concern   Not on file  Social History Narrative   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     traZODone (DESYREL) 50 MG tablet Take 1 tablet (50 mg total) by mouth at bedtime as needed. 90 tablet 1   No facility-administered medications prior to visit.    Allergies  Allergen Reactions   Sulfa Antibiotics Other (See Comments)  Bad body aches   Sulfamethoxazole-Trimethoprim Rash    Review of Systems  Constitutional:  Positive for unexpected weight change. Negative for chills, diaphoresis, fatigue and fever.  HENT:  Positive for congestion.   Eyes:  Negative for photophobia, discharge, itching and visual disturbance.  Respiratory:  Negative for cough, chest tightness, shortness of breath and wheezing.   Cardiovascular:  Negative for chest pain, palpitations and leg swelling.  Gastrointestinal:  Negative for abdominal pain, blood in stool, constipation, diarrhea, nausea and rectal pain.  Endocrine: Positive for polydipsia and polyuria. Negative for polyphagia.  Genitourinary:  Negative for difficulty urinating and flank pain.  Musculoskeletal:  Negative for arthralgias.  Skin:  Negative for rash.  Neurological:  Negative for dizziness, tremors, weakness, light-headedness and headaches.  Hematological:  Negative for  adenopathy.  Psychiatric/Behavioral:  The patient is not nervous/anxious.       Objective:    Physical Exam Vitals and nursing note reviewed.  Constitutional:      Appearance: Normal appearance. She is normal weight.  HENT:     Head: Normocephalic.     Right Ear: External ear normal.     Left Ear: External ear normal.     Nose: Nose normal.     Mouth/Throat:     Mouth: Mucous membranes are moist.  Eyes:     Extraocular Movements: Extraocular movements intact.     Conjunctiva/sclera: Conjunctivae normal.     Pupils: Pupils are equal, round, and reactive to light.  Cardiovascular:     Rate and Rhythm: Normal rate and regular rhythm.  Pulmonary:     Effort: Pulmonary effort is normal. No respiratory distress.     Breath sounds: Normal breath sounds. No stridor.  Abdominal:     General: Abdomen is flat.     Palpations: Abdomen is soft.  Musculoskeletal:        General: Normal range of motion.     Cervical back: Normal range of motion and neck supple.  Skin:    General: Skin is warm.  Neurological:     Mental Status: She is alert and oriented to person, place, and time.  Psychiatric:        Mood and Affect: Mood normal.        Behavior: Behavior normal.        Thought Content: Thought content normal.    BP (!) 146/75   Pulse 68   Temp 98.3 F (36.8 C)   Ht 5' 8.5" (1.74 m)   Wt 168 lb 3.2 oz (76.3 kg)   SpO2 97%   BMI 25.20 kg/m  Wt Readings from Last 3 Encounters:  02/10/21 168 lb 3.2 oz (76.3 kg)  09/19/20 180 lb (81.6 kg)  08/26/20 180 lb (81.6 kg)    Health Maintenance Due  Topic Date Due   Zoster Vaccines- Shingrix (1 of 2) Never done   COVID-19 Vaccine (3 - Booster for Pfizer series) 09/28/2020    There are no preventive care reminders to display for this patient.   No results found for: TSH Lab Results  Component Value Date   WBC 4.3 07/01/2020   HGB 12.3 07/01/2020   HCT 37.3 07/01/2020   MCV 88.6 07/01/2020   PLT 209 07/01/2020   Lab  Results  Component Value Date   NA 141 07/01/2020   K 4.4 07/01/2020   CO2 26 07/01/2020   GLUCOSE 93 07/01/2020   BUN 22 07/01/2020   CREATININE 0.99 07/01/2020   BILITOT 0.4 07/01/2020   ALKPHOS  61 06/26/2019   AST 15 07/01/2020   ALT 17 07/01/2020   PROT 7.0 07/01/2020   ALBUMIN 4.1 06/26/2019   CALCIUM 9.3 07/01/2020   ANIONGAP 8 11/12/2016   GFR 78.83 06/26/2019   Lab Results  Component Value Date   CHOL 213 (H) 07/01/2020   Lab Results  Component Value Date   HDL 58 07/01/2020   Lab Results  Component Value Date   LDLCALC 141 (H) 07/01/2020   Lab Results  Component Value Date   TRIG 58 07/01/2020   Lab Results  Component Value Date   CHOLHDL 3.7 07/01/2020   No results found for: HGBA1C     Assessment & Plan:   Problem List Items Addressed This Visit   None Visit Diagnoses     Polyuria    -  Primary   Relevant Orders   POCT HgB A1C   Unintentional weight loss       Dry mouth, unspecified          1. Polyuria 2. Unintentional weight loss 3. Dry mouth, unspecified  No specific findings on exam today. POCT Ha1c was 5.6, therefore no signs of diabetes. Unfortunately, we do not have a lab tech available for the holiday until next week. Will schedule labs and treat as needed pending blood work. Annual mammogram last done 07/26/20 normal. Colonoscopy UTD. HIV and Hep C previously negative. May consider CXR / autoimmune panel if her labs are unremarkable.  She will go to the ED this weekend if anything acutely worsens or changes.  Total time on encounter today: 37 minutes, discussing labs, documentation, H & P, reviewing prior screening studies.   Kemper Heupel M Roslin Norwood, PA-C

## 2021-02-10 NOTE — Patient Instructions (Signed)
Good news! Your Ha1c is normal and does not show signs of diabetes. Please schedule for labs next week - does not need to be fasting. Pending results, we will discuss further workup from there.  If acutely worse, please seek urgent care or ED this weekend.

## 2021-02-14 ENCOUNTER — Other Ambulatory Visit: Payer: Self-pay

## 2021-02-14 ENCOUNTER — Other Ambulatory Visit (INDEPENDENT_AMBULATORY_CARE_PROVIDER_SITE_OTHER): Payer: BC Managed Care – PPO

## 2021-02-14 ENCOUNTER — Telehealth (INDEPENDENT_AMBULATORY_CARE_PROVIDER_SITE_OTHER): Payer: BC Managed Care – PPO | Admitting: Physician Assistant

## 2021-02-14 DIAGNOSIS — R3589 Other polyuria: Secondary | ICD-10-CM

## 2021-02-14 DIAGNOSIS — R682 Dry mouth, unspecified: Secondary | ICD-10-CM

## 2021-02-14 DIAGNOSIS — R634 Abnormal weight loss: Secondary | ICD-10-CM | POA: Diagnosis not present

## 2021-02-14 DIAGNOSIS — Z20822 Contact with and (suspected) exposure to covid-19: Secondary | ICD-10-CM

## 2021-02-14 LAB — CBC WITH DIFFERENTIAL/PLATELET
Basophils Absolute: 0 10*3/uL (ref 0.0–0.1)
Basophils Relative: 0.1 % (ref 0.0–3.0)
Eosinophils Absolute: 0.1 10*3/uL (ref 0.0–0.7)
Eosinophils Relative: 2.3 % (ref 0.0–5.0)
HCT: 34.3 % — ABNORMAL LOW (ref 36.0–46.0)
Hemoglobin: 11.4 g/dL — ABNORMAL LOW (ref 12.0–15.0)
Lymphocytes Relative: 36 % (ref 12.0–46.0)
Lymphs Abs: 1.4 10*3/uL (ref 0.7–4.0)
MCHC: 33.3 g/dL (ref 30.0–36.0)
MCV: 90.7 fl (ref 78.0–100.0)
Monocytes Absolute: 0.3 10*3/uL (ref 0.1–1.0)
Monocytes Relative: 8.9 % (ref 3.0–12.0)
Neutro Abs: 2 10*3/uL (ref 1.4–7.7)
Neutrophils Relative %: 52.7 % (ref 43.0–77.0)
Platelets: 185 10*3/uL (ref 150.0–400.0)
RBC: 3.78 Mil/uL — ABNORMAL LOW (ref 3.87–5.11)
RDW: 12.8 % (ref 11.5–15.5)
WBC: 3.8 10*3/uL — ABNORMAL LOW (ref 4.0–10.5)

## 2021-02-14 LAB — COMPREHENSIVE METABOLIC PANEL
ALT: 51 U/L — ABNORMAL HIGH (ref 0–35)
AST: 43 U/L — ABNORMAL HIGH (ref 0–37)
Albumin: 3.9 g/dL (ref 3.5–5.2)
Alkaline Phosphatase: 66 U/L (ref 39–117)
BUN: 16 mg/dL (ref 6–23)
CO2: 27 mEq/L (ref 19–32)
Calcium: 8.7 mg/dL (ref 8.4–10.5)
Chloride: 108 mEq/L (ref 96–112)
Creatinine, Ser: 0.82 mg/dL (ref 0.40–1.20)
GFR: 76.1 mL/min (ref >60.00)
Glucose, Bld: 87 mg/dL (ref 70–99)
Potassium: 3.7 mEq/L (ref 3.5–5.1)
Sodium: 142 mEq/L (ref 135–145)
Total Bilirubin: 0.4 mg/dL (ref 0.2–1.2)
Total Protein: 7.1 g/dL (ref 6.0–8.3)

## 2021-02-14 LAB — POC URINALSYSI DIPSTICK (AUTOMATED)
Bilirubin, UA: NEGATIVE
Blood, UA: NEGATIVE
Glucose, UA: NEGATIVE
Ketones, UA: NEGATIVE
Nitrite, UA: NEGATIVE
Protein, UA: NEGATIVE
Spec Grav, UA: 1.015 (ref 1.010–1.025)
Urobilinogen, UA: 0.2 E.U./dL
pH, UA: 6 (ref 5.0–8.0)

## 2021-02-14 LAB — TSH: TSH: 2.13 u[IU]/mL (ref 0.35–5.50)

## 2021-02-14 LAB — C-REACTIVE PROTEIN: CRP: <1 mg/dL (ref 0.5–20.0)

## 2021-02-14 LAB — SEDIMENTATION RATE: Sed Rate: 33 mm/hr — ABNORMAL HIGH (ref 0–30)

## 2021-02-14 NOTE — Patient Instructions (Signed)
Your urinalysis in office shows only a trace amount of bacteria which could be from contamination.  I will wait to treat with an antibiotic if the urine culture comes back obviously positive.  Please push fluids.  You will be contacted via MyChart about all of your results.

## 2021-02-14 NOTE — Progress Notes (Signed)
Virtual Visit via Telephone Note  I connected with Romona Curls on 02/14/21 at 11:00 AM EDT by telephone and verified that I am speaking with the correct person using two identifiers.  Location: Patient: Parked car   Provider: Therapist, music at [employer]  I discussed the limitations, risks, security and privacy concerns of performing an evaluation and management service by telephone and the availability of in person appointments. I also discussed with the patient that there may be a patient responsible charge related to this service. The patient expressed understanding and agreed to proceed.   History of Present Illness: Pleasant 65 year old patient presented to our office today for blood work following office visit on Friday, 02/10/2021.  While she was having her labs checked, she noted to the lab technician that she was not feeling well.  She started to have body aches and headache develop on the day after her appointment last week.  She notes that she also had a scratchy throat and cough that had developed some during the week.  She denies any fever or chills, she did have exposure to COVID-19 at work last week.  She also noted having some abdominal discomfort this morning and still having some frequent urination.  She was requesting to have a COVID test done in our office as well as a urine check while she was here.   Observations/Objective: Speaking in full complete sentences without pauses or gasps for breath.  Alert and oriented x3.  No signs of congestion or coughing.  Assessment and Plan: 1. Polyuria Urinalysis performed in office showing trace amount of leukocytes.  We will also send this off for culture and treat for infection if needed.  Encouraged her to drink plenty of water.  2. Contact with and (suspected) exposure to covid-19 COVID test performed at curbside outside of our office today.  She needs to continue to wear a mask and monitor her symptoms closely.   For any acute worsening changes she will need to go to the urgent or emergent care and patient is understanding of this.  Results will be sent via [place].   Follow Up Instructions:    I discussed the assessment and treatment plan with the patient. The patient was provided an opportunity to ask questions and all were answered. The patient agreed with the plan and demonstrated an understanding of the instructions.   The patient was advised to call back or seek an in-person evaluation if the symptoms worsen or if the condition fails to improve as anticipated.  I provided 6 minutes 52 seconds of non-face-to-face time during this encounter.   Katricia Prehn M Natahsa Marian, PA-C

## 2021-02-15 LAB — NOVEL CORONAVIRUS, NAA: SARS-CoV-2, NAA: DETECTED — AB

## 2021-02-15 LAB — SARS-COV-2, NAA 2 DAY TAT

## 2021-02-15 LAB — URINE CULTURE
MICRO NUMBER:: 12081464
SPECIMEN QUALITY:: ADEQUATE

## 2021-02-16 ENCOUNTER — Other Ambulatory Visit: Payer: Self-pay | Admitting: Physician Assistant

## 2021-02-16 ENCOUNTER — Other Ambulatory Visit: Payer: Self-pay

## 2021-02-16 DIAGNOSIS — R899 Unspecified abnormal finding in specimens from other organs, systems and tissues: Secondary | ICD-10-CM

## 2021-02-16 MED ORDER — CEPHALEXIN 500 MG PO CAPS: 500.0000 mg | ORAL_CAPSULE | Freq: Three times a day (TID) | ORAL | 0 refills | Status: AC

## 2021-02-23 ENCOUNTER — Telehealth: Payer: Self-pay

## 2021-02-23 NOTE — Telephone Encounter (Signed)
Left voice message for patient to call clinic.  

## 2021-02-23 NOTE — Telephone Encounter (Signed)
Patient called in and had covid on July 6th she stated she is going back to work tonight but she still is having a dry cough and chest tightness. Patient wants to make sure its still ok for her to go back to work. Patient wanted to speak to [facility].

## 2021-02-24 NOTE — Telephone Encounter (Signed)
Left voice message for patient to call clinic. Left a mychart message with information below.

## 2021-02-27 NOTE — Telephone Encounter (Signed)
Patient notified,she is feeling better and back to work

## 2021-03-31 ENCOUNTER — Other Ambulatory Visit: Payer: Self-pay

## 2021-03-31 ENCOUNTER — Ambulatory Visit: Payer: BC Managed Care – PPO | Admitting: Internal Medicine

## 2021-03-31 VITALS — BP 130/80 | HR 48 | Temp 98.0°F | Wt 166.8 lb

## 2021-03-31 DIAGNOSIS — R21 Rash and other nonspecific skin eruption: Secondary | ICD-10-CM | POA: Diagnosis not present

## 2021-03-31 MED ORDER — PREDNISONE 10 MG (21) PO TBPK: ORAL_TABLET | ORAL | 0 refills | Status: DC

## 2021-03-31 MED ORDER — METHYLPREDNISOLONE ACETATE 80 MG/ML IJ SUSP
80.0000 mg | Freq: Once | INTRAMUSCULAR | Status: AC
Start: 2021-03-31 — End: 2021-03-31
  Administered 2021-03-31: 80 mg via INTRAMUSCULAR

## 2021-03-31 NOTE — Addendum Note (Signed)
Addended by: Westley Hummer B on: 03/31/2021 05:07 PM   Modules accepted: Orders

## 2021-03-31 NOTE — Progress Notes (Signed)
Acute office Visit     This visit occurred during the SARS-CoV-2 public health emergency.  Safety protocols were in place, including screening questions prior to the visit, additional usage of staff PPE, and extensive cleaning of exam room while observing appropriate contact time as indicated for disinfecting solutions.    CC/Reason for Visit: Pruritic rash  HPI: Claudia Adkins is a 64 y.o. female who is coming in today for the above mentioned reasons.  She was visiting family members in [place] last week.  2 weeks ago she had what she thought was COVID with a viral prodrome consisting of chills, URI symptoms.  Over the weekend, 6 days ago she developed a rash of which I will insert pictures below.  She thinks they are insect bites.  She remembers feeling the "bites" when she was lying in the bed at her sister's house.  They are very pruritic.  No known monkeypox exposures.  She had varicella as a child.                     Past Medical/Surgical History: Past Medical History:  Diagnosis Date   Allergy    Anemia     Past Surgical History:  Procedure Laterality Date   ABDOMINAL HYSTERECTOMY     CESAREAN SECTION      Social History:  reports that she has quit smoking. Her smoking use included cigarettes. She has a 5.00 pack-year smoking history. She has never used smokeless tobacco. She reports current alcohol use. She reports that she does not use drugs.  Allergies: Allergies  Allergen Reactions   Sulfa Antibiotics Other (See Comments)    Bad body aches   Sulfamethoxazole-Trimethoprim Rash    Family History:  Family History  Problem Relation Age of Onset   Hyperlipidemia Mother    Hypertension Mother    Mental illness Mother    Breast cancer Neg Hx    Colon cancer Neg Hx    Pancreatic cancer Neg Hx    Esophageal cancer Neg Hx    Colon polyps Neg Hx    Rectal cancer Neg Hx    Stomach cancer Neg Hx      Current Outpatient Medications:     Multiple Vitamin (MULTIVITAMIN) tablet, Take 1 tablet by mouth daily., Disp: , Rfl:    predniSONE (STERAPRED UNI-PAK 21 TAB) 10 MG (21) TBPK tablet, Take as directed, Disp: 21 tablet, Rfl: 0   traZODone (DESYREL) 50 MG tablet, Take 1 tablet (50 mg total) by mouth at bedtime as needed., Disp: 90 tablet, Rfl: 1  Review of Systems:  Constitutional: Denies fever, chills, diaphoresis, appetite change and fatigue.  HEENT: Denies photophobia, eye pain, redness, hearing loss, ear pain, congestion, sore throat, rhinorrhea, sneezing, mouth sores, trouble swallowing, neck pain, neck stiffness and tinnitus.   Respiratory: Denies SOB, DOE, cough, chest tightness,  and wheezing.   Cardiovascular: Denies chest pain, palpitations and leg swelling.  Gastrointestinal: Denies nausea, vomiting, abdominal pain, diarrhea, constipation, blood in stool and abdominal distention.  Genitourinary: Denies dysuria, urgency, frequency, hematuria, flank pain and difficulty urinating.  Endocrine: Denies: hot or cold intolerance, sweats, changes in hair or nails, polyuria, polydipsia. Musculoskeletal: Denies myalgias, back pain, joint swelling, arthralgias and gait problem.  Neurological: Denies dizziness, seizures, syncope, weakness, light-headedness, numbness and headaches.  Hematological: Denies adenopathy. Easy bruising, personal or family bleeding history  Psychiatric/Behavioral: Denies suicidal ideation, mood changes, confusion, nervousness, sleep disturbance and agitation    Physical Exam: Vitals:  03/31/21 1534  BP: 130/80  Pulse: (!) 48  Temp: 98 F (36.7 C)  TempSrc: Oral  SpO2: 97%  Weight: 166 lb 12.8 oz (75.7 kg)    Body mass index is 24.99 kg/m.   Constitutional: NAD, calm, comfortable Eyes: PERRL, lids and conjunctivae normal ENMT: Mucous membranes are moist.  Skin: See above pictures of rash. Neurologic: Grossly intact and nonfocal Psychiatric: Normal judgment and insight. Alert and oriented x  3. Normal mood.    Impression and Plan:  Rash  - Plan: Monkeypox Virus DNA, Qualitative Real-Time PCR, predniSONE (STERAPRED UNI-PAK 21 TAB) 10 MG (21) TBPK tablet -Discussed with Dr. Graylon Good infectious diseases.  Feel it is unlikely to be monkey pox given lack of known exposure and appearance of rash, however we feel it is reasonable to test for it. -Using a small needle I have unroofed one of the lesions on her arm and have swabbed vigorously.  Appropriate PPE was used. -Given how intensely pruritic this rash is, I will go ahead and give 80 mg of Depo-Medrol today followed by a prednisone Dosepak to start tomorrow. -There are no lesions in the webs of her hands or toes, unlikely appearance for scabies. -Question fleas/ants.  Time spent: 38 minutes reviewing chart, interviewing and examining patient, discussing case with infectious diseases and formulating plan of care.     Lelon Frohlich, MD [facility] at [facility]

## 2021-04-09 ENCOUNTER — Encounter: Payer: Self-pay | Admitting: Internal Medicine

## 2021-04-10 LAB — TIQ-NTM

## 2021-04-10 LAB — MONKEYPOX VIRUS DNA, QUALITATIVE REAL-TIME PCR

## 2021-06-22 ENCOUNTER — Other Ambulatory Visit: Payer: Self-pay | Admitting: Physician Assistant

## 2021-06-22 DIAGNOSIS — Z1231 Encounter for screening mammogram for malignant neoplasm of breast: Secondary | ICD-10-CM

## 2021-07-18 ENCOUNTER — Other Ambulatory Visit: Payer: Self-pay

## 2021-07-18 ENCOUNTER — Ambulatory Visit (INDEPENDENT_AMBULATORY_CARE_PROVIDER_SITE_OTHER): Payer: BC Managed Care – PPO | Admitting: Physician Assistant

## 2021-07-18 ENCOUNTER — Encounter: Payer: Self-pay | Admitting: Physician Assistant

## 2021-07-18 VITALS — BP 126/78 | HR 60 | Temp 97.8°F | Ht 65.0 in | Wt 166.1 lb

## 2021-07-18 DIAGNOSIS — Z0000 Encounter for general adult medical examination without abnormal findings: Secondary | ICD-10-CM | POA: Diagnosis not present

## 2021-07-18 DIAGNOSIS — R710 Precipitous drop in hematocrit: Secondary | ICD-10-CM

## 2021-07-18 DIAGNOSIS — E785 Hyperlipidemia, unspecified: Secondary | ICD-10-CM

## 2021-07-18 DIAGNOSIS — G4700 Insomnia, unspecified: Secondary | ICD-10-CM

## 2021-07-18 DIAGNOSIS — R634 Abnormal weight loss: Secondary | ICD-10-CM

## 2021-07-18 LAB — LIPID PANEL
Cholesterol: 211 mg/dL — ABNORMAL HIGH (ref 0–200)
HDL: 68.8 mg/dL (ref >39.00)
LDL Cholesterol: 132 mg/dL — ABNORMAL HIGH (ref 0–99)
NonHDL: 142.05
Total CHOL/HDL Ratio: 3
Triglycerides: 51 mg/dL (ref 0.0–149.0)
VLDL: 10.2 mg/dL (ref 0.0–40.0)

## 2021-07-18 LAB — CBC WITH DIFFERENTIAL/PLATELET
Basophils Absolute: 0 10*3/uL (ref 0.0–0.1)
Basophils Relative: 0.7 % (ref 0.0–3.0)
Eosinophils Absolute: 0.1 10*3/uL (ref 0.0–0.7)
Eosinophils Relative: 2.6 % (ref 0.0–5.0)
HCT: 39 % (ref 36.0–46.0)
Hemoglobin: 12.6 g/dL (ref 12.0–15.0)
Lymphocytes Relative: 44.2 % (ref 12.0–46.0)
Lymphs Abs: 1.8 10*3/uL (ref 0.7–4.0)
MCHC: 32.2 g/dL (ref 30.0–36.0)
MCV: 91.8 fl (ref 78.0–100.0)
Monocytes Absolute: 0.3 10*3/uL (ref 0.1–1.0)
Monocytes Relative: 8.2 % (ref 3.0–12.0)
Neutro Abs: 1.9 10*3/uL (ref 1.4–7.7)
Neutrophils Relative %: 44.3 % (ref 43.0–77.0)
Platelets: 188 10*3/uL (ref 150.0–400.0)
RBC: 4.24 Mil/uL (ref 3.87–5.11)
RDW: 13.9 % (ref 11.5–15.5)
WBC: 4.2 10*3/uL (ref 4.0–10.5)

## 2021-07-18 LAB — IBC + FERRITIN
Ferritin: 74.5 ng/mL (ref 10.0–291.0)
Iron: 54 ug/dL (ref 42–145)
Saturation Ratios: 15.9 % — ABNORMAL LOW (ref 20.0–50.0)
TIBC: 338.8 ug/dL (ref 250.0–450.0)
Transferrin: 242 mg/dL (ref 212.0–360.0)

## 2021-07-18 NOTE — Patient Instructions (Signed)
It was great to see you! ? ?Please go to the lab for blood work.  ? ?Our office will call you with your results unless you have chosen to receive results via MyChart. ? ?If your blood work is normal we will follow-up each year for physicals and as scheduled for chronic medical problems. ? ?If anything is abnormal we will treat accordingly and get you in for a follow-up. ? ?Take care, ? ?Nainoa Woldt ?  ? ? ?

## 2021-07-18 NOTE — Progress Notes (Signed)
Subjective:    Claudia Adkins is a 65 y.o. female and is here for a comprehensive physical exam.  HPI  Health Maintenance Due  Topic Date Due   Zoster Vaccines- Shingrix (1 of 2) Never done    Acute Concerns: None discussed at this time.  Chronic Issues: Insomnia At this time Claudia Adkins is not taking the trazodone due to it causing her to have weird dreams. Instead she has transitioned to drinking chamomile tea which has helped. 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   Hx of Unintentional Weight Loss Back in July of this year, Claudia Adkins presented to [facility] for unintentional weight loss, urinary frquency, dry mouth, and increase in salivary production. Stated at that time sx were happening gradually as June went on. Labs were mostly unremarkable, including her hemoglobin A1c, except for a slight decrease in her Hgb. She was advised to have this rechecked but she did not. Denies any overt rectal bleeding or any vaginal bleeding.  HLD Patient has a hx of this but is currently not on any medications.  Health Maintenance: Immunizations -- Covid- UTD Tdap- UTD; 2015  Colonoscopy -- UTD; 2022 Mammogram -- Scheduled for 07/31/21 PAP -- N/A Dentistry- UTD Ophthalmology- UTD Bone Density -- UTD; 2021 Diet -- Eats all food groups Sleep habits -- Normal amount of sleep Exercise -- Not regularly Weight -- Stable Mood -- Stable Weight history: Wt Readings from Last 10 Encounters:  07/18/21 166 lb 2 oz (75.4 kg)  03/31/21 166 lb 12.8 oz (75.7 kg)  02/10/21 168 lb 3.2 oz (76.3 kg)  09/19/20 180 lb (81.6 kg)  08/26/20 180 lb (81.6 kg)  07/01/20 180 lb (81.6 kg)  05/11/20 183 lb 6.1 oz (83.2 kg)  06/26/19 191 lb (86.6 kg)  05/28/18 180 lb 4 oz (81.8 kg)  12/20/17 186 lb 4 oz (84.5 kg)   Body mass index is 27.64 kg/m. No LMP recorded. Patient has had a hysterectomy. Alcohol use:  reports current alcohol use. Tobacco use:  Tobacco Use: Medium Risk    Smoking Tobacco Use: Former   Smokeless Tobacco Use: Never   Passive Exposure: Not on file     Depression screen [facility] 2/9 07/18/2021  Decreased Interest 0  Down, Depressed, Hopeless 0  PHQ - 2 Score 0  Altered sleeping 1  Tired, decreased energy 0  Change in appetite 0  Feeling bad or failure about yourself  0  Trouble concentrating 0  Moving slowly or fidgety/restless 0  PHQ-9 Score 1  Difficult doing work/chores Somewhat difficult     Other providers/specialists: Patient Care Team: Inda Coke, [place] as PCP - General (Physician Assistant)    PMHx, SurgHx, SocialHx, Medications, and Allergies were reviewed in the Visit Navigator and updated as appropriate.   Past Medical History:  Diagnosis Date   Allergy    Anemia      Past Surgical History:  Procedure Laterality Date   ABDOMINAL HYSTERECTOMY     CESAREAN SECTION       Family History  Problem Relation Age of Onset   Hyperlipidemia Mother    Hypertension Mother    Mental illness Mother    Breast cancer Neg Hx    Colon cancer Neg Hx    Pancreatic cancer Neg Hx    Esophageal cancer Neg Hx    Colon polyps Neg Hx    Rectal cancer Neg Hx    Stomach cancer Neg Hx     Social History  Tobacco Use   Smoking status: Former    Packs/day: 0.50    Years: 10.00    Pack years: 5.00    Types: Cigarettes   Smokeless tobacco: Never   Tobacco comments:    quit 30 years ago  Vaping Use   Vaping Use: Never used  Substance Use Topics   Alcohol use: Yes    Comment: occ   Drug use: No    Review of Systems:   Review of Systems  Constitutional:  Negative for chills, fever, malaise/fatigue and weight loss.  HENT:  Negative for hearing loss, sinus pain and sore throat.   Respiratory:  Negative for cough and hemoptysis.   Cardiovascular:  Negative for chest pain, palpitations, leg swelling and PND.  Gastrointestinal:  Negative for abdominal pain, constipation, diarrhea, heartburn, nausea and vomiting.   Genitourinary:  Negative for dysuria, frequency and urgency.  Musculoskeletal:  Negative for back pain, myalgias and neck pain.  Skin:  Negative for itching and rash.  Neurological:  Negative for dizziness, tingling, seizures and headaches.  Endo/Heme/Allergies:  Negative for polydipsia.  Psychiatric/Behavioral:  Negative for depression. The patient has insomnia. The patient is not nervous/anxious.     Objective:   BP 126/78   Pulse 60   Temp 97.8 F (36.6 C) (Temporal)   Ht 5\' 5"  (1.651 m)   Wt 166 lb 2 oz (75.4 kg)   SpO2 99%   BMI 27.64 kg/m  Body mass index is 27.64 kg/m.   General Appearance:    Alert, cooperative, no distress, appears stated age  Head:    Normocephalic, without obvious abnormality, atraumatic  Eyes:    PERRL, conjunctiva/corneas clear, EOM's intact, fundi    benign, both eyes  Ears:    Normal TM's and external ear canals, both ears  Nose:   Nares normal, septum midline, mucosa normal, no drainage    or sinus tenderness  Throat:   Lips, mucosa, and tongue normal; teeth and gums normal  Neck:   Supple, symmetrical, trachea midline, no adenopathy;    thyroid:  no enlargement/tenderness/nodules; no carotid   bruit or JVD  Back:     Symmetric, no curvature, ROM normal, no CVA tenderness  Lungs:     Clear to auscultation bilaterally, respirations unlabored  Chest Wall:    No tenderness or deformity   Heart:    Regular rate and rhythm, S1 and S2 normal, no murmur, rub or gallop  Breast Exam:    Deferred  Abdomen:     Soft, non-tender, bowel sounds active all four quadrants,    no masses, no organomegaly  Genitalia:    Deferred  Extremities:   Extremities normal, atraumatic, no cyanosis or edema  Pulses:   2+ and symmetric all extremities  Skin:   Skin color, texture, turgor normal, no rashes or lesions  Lymph nodes:   Cervical, supraclavicular, and axillary nodes normal  Neurologic:   CNII-XII intact, normal strength, sensation and reflexes     throughout    Assessment/Plan:   Routine Physical Examination Today patient counseled on age appropriate routine health concerns for screening and prevention, each reviewed and up to date or declined. Immunizations reviewed and up to date or declined. Labs ordered and reviewed. Risk factors for depression reviewed and negative. Hearing function and visual acuity are intact. ADLs screened and addressed as needed. Functional ability and level of safety reviewed and appropriate. Education, counseling and referrals performed based on assessed risks today. Patient provided with a copy of personalized  plan for preventive services.  HLD Will update labs, start medication as indicated by lab results  -Lipid Profile  Decreased hemoglobin Update labs today to re-evaluate this today and follow-up from last lab check. Will follow up as needed as indicated by lab results and provide further recommendations as indicated  Unintentional weight loss Weight has stablilized, however I did ask her to continue to monitor this -- if ongoing decline of weight, I have asked her to follow-up with [place].   Patient Counseling: [x]    Nutrition: Stressed importance of moderation in sodium/caffeine intake, saturated fat and cholesterol, caloric balance, sufficient intake of fresh fruits, vegetables, fiber, calcium, iron, and 1 mg of folate supplement per day (for females capable of pregnancy).  [x]    Stressed the importance of regular exercise.   [x]    Substance Abuse: Discussed cessation/primary prevention of tobacco, alcohol, or other drug use; driving or other dangerous activities under the influence; availability of treatment for abuse.   [x]    Injury prevention: Discussed safety belts, safety helmets, smoke detector, smoking near bedding or upholstery.   [x]    Sexuality: Discussed sexually transmitted diseases, partner selection, use of condoms, avoidance of unintended pregnancy  and contraceptive alternatives.  [x]     Dental health: Discussed importance of regular tooth brushing, flossing, and dental visits.  [x]    Health maintenance and immunizations reviewed. Please refer to Health maintenance section.   I,Havlyn C Ratchford,acting as a Education administrator for [employer], PA.,have documented all relevant documentation on the behalf of Inda Coke, PA,as directed by  Inda Coke, PA while in the presence of Inda Coke, [place].  I, Inda Coke, [place], have reviewed all documentation for this visit. The documentation on 07/18/21 for the exam, diagnosis, procedures, and orders are all accurate and complete.   Inda Coke, PA-C [place]

## 2021-07-31 ENCOUNTER — Ambulatory Visit
Admission: RE | Admit: 2021-07-31 | Discharge: 2021-07-31 | Disposition: A | Discharge status: Home / Self Care | Payer: BC Managed Care – PPO | Source: Ambulatory Visit | Attending: Physician Assistant | Admitting: Physician Assistant

## 2021-07-31 ENCOUNTER — Encounter: Payer: BC Managed Care – PPO | Admitting: Physician Assistant

## 2021-07-31 DIAGNOSIS — Z1231 Encounter for screening mammogram for malignant neoplasm of breast: Secondary | ICD-10-CM

## 2021-11-29 ENCOUNTER — Encounter: Payer: Self-pay | Admitting: Physician Assistant

## 2021-11-29 ENCOUNTER — Ambulatory Visit: Payer: BC Managed Care – PPO | Admitting: Physician Assistant

## 2021-11-29 VITALS — BP 106/64 | HR 70 | Temp 97.9°F | Ht 65.0 in | Wt 165.6 lb

## 2021-11-29 DIAGNOSIS — R197 Diarrhea, unspecified: Secondary | ICD-10-CM | POA: Diagnosis not present

## 2021-11-29 LAB — CBC WITH DIFFERENTIAL/PLATELET
Basophils Absolute: 0 10*3/uL (ref 0.0–0.1)
Basophils Relative: 0.5 % (ref 0.0–3.0)
Eosinophils Absolute: 0.1 10*3/uL (ref 0.0–0.7)
Eosinophils Relative: 1.8 % (ref 0.0–5.0)
HCT: 40.4 % (ref 36.0–46.0)
Hemoglobin: 13.4 g/dL (ref 12.0–15.0)
Lymphocytes Relative: 40.6 % (ref 12.0–46.0)
Lymphs Abs: 1.3 10*3/uL (ref 0.7–4.0)
MCHC: 33.2 g/dL (ref 30.0–36.0)
MCV: 91.5 fl (ref 78.0–100.0)
Monocytes Absolute: 0.5 10*3/uL (ref 0.1–1.0)
Monocytes Relative: 14.5 % — ABNORMAL HIGH (ref 3.0–12.0)
Neutro Abs: 1.4 10*3/uL (ref 1.4–7.7)
Neutrophils Relative %: 42.6 % — ABNORMAL LOW (ref 43.0–77.0)
Platelets: 171 10*3/uL (ref 150.0–400.0)
RBC: 4.41 Mil/uL (ref 3.87–5.11)
RDW: 13 % (ref 11.5–15.5)
WBC: 3.3 10*3/uL — ABNORMAL LOW (ref 4.0–10.5)

## 2021-11-29 LAB — COMPREHENSIVE METABOLIC PANEL
ALT: 18 U/L (ref 0–35)
AST: 23 U/L (ref 0–37)
Albumin: 4.1 g/dL (ref 3.5–5.2)
Alkaline Phosphatase: 56 U/L (ref 39–117)
BUN: 14 mg/dL (ref 6–23)
CO2: 27 mEq/L (ref 19–32)
Calcium: 8.7 mg/dL (ref 8.4–10.5)
Chloride: 108 mEq/L (ref 96–112)
Creatinine, Ser: 1.06 mg/dL (ref 0.40–1.20)
GFR: 55.61 mL/min — ABNORMAL LOW (ref >60.00)
Glucose, Bld: 95 mg/dL (ref 70–99)
Potassium: 3.9 mEq/L (ref 3.5–5.1)
Sodium: 141 mEq/L (ref 135–145)
Total Bilirubin: 0.3 mg/dL (ref 0.2–1.2)
Total Protein: 7.2 g/dL (ref 6.0–8.3)

## 2021-11-29 MED ORDER — ONDANSETRON HCL 4 MG PO TABS: 4.0000 mg | ORAL_TABLET | Freq: Three times a day (TID) | ORAL | 0 refills | Status: DC | PRN

## 2021-11-29 NOTE — Patient Instructions (Signed)
It was great to see you! ? ?I suspect you have a GI bug ? ?Push bland foods as able (see handout) ? ?Stay hydrated ? ?Use zofran as needed for nausea ? ?Keep me posted if symptoms worsen or do not improve ? ?Take care, ? ?Inda Coke PA-C  ? ?

## 2021-11-29 NOTE — Progress Notes (Signed)
Claudia Adkins is a 66 y.o. female here for nausea. ? ?History of Present Illness:  ? ?Chief Complaint  ?Patient presents with  ? Nausea  ?  Pt c/o nausea and previous episodes of diarrhea; now stomach is feeling quezzy started on Monday;   ? ? ?HPI ? ?Nausea  ?Claudia Adkins presents with c/o nausea that has been onset for 2 days. States that prior to nausea, she had also experienced 2 episodes of diarrhea and vomiting at the same time. Reports that during these episodes she experienced cold sweats and mild pain in both legs that resolved following the episode. Although the vomiting and diarrhea has resolved, she is still feeling queasy to the point that she isn't eating as much. In an effort to not upset her stomach anymore she has been eating crackers and trying to drink plenty of fluids.  ? ?Denies recent sick contact, dizziness, lightheadedness, hematemesis, hematochezia, CP, SOB, or fever.  ? ?Reviewed last colonoscopy on Feb 2022 -- overall normal - no concerning diverticular disease noted. ? ? ?Past Medical History:  ?Diagnosis Date  ? Allergy   ? Anemia   ? ?  ?Social History  ? ?Tobacco Use  ? Smoking status: Former  ?  Packs/day: 0.50  ?  Years: 10.00  ?  Pack years: 5.00  ?  Types: Cigarettes  ? Smokeless tobacco: Never  ? Tobacco comments:  ?  quit 30 years ago  ?Vaping Use  ? Vaping Use: Never used  ?Substance Use Topics  ? Alcohol use: Yes  ?  Comment: occ  ? Drug use: No  ? ? ?Past Surgical History:  ?Procedure Laterality Date  ? ABDOMINAL HYSTERECTOMY    ? BREAST BIOPSY Right   ? 1999? benign  ? CESAREAN SECTION    ? ? ?Family History  ?Problem Relation Age of Onset  ? Hyperlipidemia Mother   ? Hypertension Mother   ? Mental illness Mother   ? Breast cancer Neg Hx   ? Colon cancer Neg Hx   ? Pancreatic cancer Neg Hx   ? Esophageal cancer Neg Hx   ? Colon polyps Neg Hx   ? Rectal cancer Neg Hx   ? Stomach cancer Neg Hx   ? ? ?Allergies  ?Allergen Reactions  ? Sulfa Antibiotics Other (See Comments)  ?  Bad  body aches  ? Sulfamethoxazole-Trimethoprim Rash  ? ? ?Current Medications:  ? ?Current Outpatient Medications:  ?  Multiple Vitamin (MULTIVITAMIN) tablet, Take 1 tablet by mouth daily., Disp: , Rfl:  ?  predniSONE (STERAPRED UNI-PAK 21 TAB) 10 MG (21) TBPK tablet, Take as directed (Patient not taking: Reported on 11/29/2021), Disp: 21 tablet, Rfl: 0 ?  traZODone (DESYREL) 50 MG tablet, Take 1 tablet (50 mg total) by mouth at bedtime as needed. (Patient not taking: Reported on 11/29/2021), Disp: 90 tablet, Rfl: 1  ? ?Review of Systems:  ? ?ROS ?Negative unless otherwise specified per HPI. ?Vitals:  ? ?Vitals:  ? 11/29/21 1340  ?BP: 106/64  ?Pulse: 70  ?Temp: 97.9 ?F (36.6 ?C)  ?TempSrc: Temporal  ?SpO2: 93%  ?Weight: 165 lb 9.6 oz (75.1 kg)  ?Height: '5\' 5"'$  (1.651 m)  ?   ?Body mass index is 27.56 kg/m?. ? ?Physical Exam:  ? ?Physical Exam ?Vitals and nursing note reviewed.  ?Constitutional:   ?   General: She is not in acute distress. ?   Appearance: She is well-developed. She is not ill-appearing or toxic-appearing.  ?Cardiovascular:  ?   Rate  and Rhythm: Normal rate and regular rhythm.  ?   Pulses: Normal pulses.  ?   Heart sounds: Normal heart sounds, S1 normal and S2 normal.  ?Pulmonary:  ?   Effort: Pulmonary effort is normal.  ?   Breath sounds: Normal breath sounds.  ?Abdominal:  ?   General: Abdomen is flat. Bowel sounds are increased.  ?   Palpations: Abdomen is soft.  ?   Tenderness: There is no abdominal tenderness.  ?Skin: ?   General: Skin is warm and dry.  ?Neurological:  ?   Mental Status: She is alert.  ?   GCS: GCS eye subscore is 4. GCS verbal subscore is 5. GCS motor subscore is 6.  ?Psychiatric:     ?   Speech: Speech normal.     ?   Behavior: Behavior normal. Behavior is cooperative.  ? ? ?Assessment and Plan:  ? ?Diarrhea of presumed infectious origin ?No red flags-- suspect GI infection ?Trial Zofran 4 mg as needed for nausea  ?Update CBC and CMP per patient request ?Encouraged patient to eat  bland foods and push more fluids-- provided handout information  ?Follow up if new/worsening symptoms or concerns occur  ? ? ?I,Havlyn C Ratchford,acting as a scribe for [employer], PA.,have documented all relevant documentation on the behalf of Inda Coke, PA,as directed by  Inda Coke, PA while in the presence of Inda Coke, [place]. ? ?IInda Coke, PA, have reviewed all documentation for this visit. The documentation on 11/29/21 for the exam, diagnosis, procedures, and orders are all accurate and complete. ? ? ?Inda Coke, PA-C ? ?

## 2022-05-07 ENCOUNTER — Encounter: Payer: Self-pay | Admitting: *Deleted

## 2022-06-26 DIAGNOSIS — K5900 Constipation, unspecified: Secondary | ICD-10-CM | POA: Insufficient documentation

## 2022-06-26 DIAGNOSIS — R141 Gas pain: Secondary | ICD-10-CM | POA: Insufficient documentation

## 2022-07-17 ENCOUNTER — Other Ambulatory Visit: Payer: Self-pay | Admitting: Physician Assistant

## 2022-07-17 DIAGNOSIS — Z1231 Encounter for screening mammogram for malignant neoplasm of breast: Secondary | ICD-10-CM

## 2022-07-26 ENCOUNTER — Encounter: Payer: Self-pay | Admitting: *Deleted

## 2022-07-30 ENCOUNTER — Encounter: Payer: BC Managed Care – PPO | Admitting: Physician Assistant

## 2022-08-02 ENCOUNTER — Ambulatory Visit (INDEPENDENT_AMBULATORY_CARE_PROVIDER_SITE_OTHER): Payer: BC Managed Care – PPO | Admitting: Physician Assistant

## 2022-08-02 ENCOUNTER — Encounter: Payer: Self-pay | Admitting: Physician Assistant

## 2022-08-02 VITALS — BP 140/90 | HR 60 | Temp 98.2°F | Ht 65.0 in | Wt 162.5 lb

## 2022-08-02 DIAGNOSIS — E663 Overweight: Secondary | ICD-10-CM | POA: Diagnosis not present

## 2022-08-02 DIAGNOSIS — M8580 Other specified disorders of bone density and structure, unspecified site: Secondary | ICD-10-CM

## 2022-08-02 DIAGNOSIS — M79651 Pain in right thigh: Secondary | ICD-10-CM

## 2022-08-02 DIAGNOSIS — E785 Hyperlipidemia, unspecified: Secondary | ICD-10-CM | POA: Diagnosis not present

## 2022-08-02 DIAGNOSIS — Z0000 Encounter for general adult medical examination without abnormal findings: Secondary | ICD-10-CM

## 2022-08-02 DIAGNOSIS — R634 Abnormal weight loss: Secondary | ICD-10-CM

## 2022-08-02 LAB — CBC WITH DIFFERENTIAL/PLATELET
Basophils Absolute: 0 10*3/uL (ref 0.0–0.1)
Basophils Relative: 0.6 % (ref 0.0–3.0)
Eosinophils Absolute: 0.2 10*3/uL (ref 0.0–0.7)
Eosinophils Relative: 4 % (ref 0.0–5.0)
HCT: 38.1 % (ref 36.0–46.0)
Hemoglobin: 12.5 g/dL (ref 12.0–15.0)
Lymphocytes Relative: 45.2 % (ref 12.0–46.0)
Lymphs Abs: 2 10*3/uL (ref 0.7–4.0)
MCHC: 32.8 g/dL (ref 30.0–36.0)
MCV: 90.8 fl (ref 78.0–100.0)
Monocytes Absolute: 0.5 10*3/uL (ref 0.1–1.0)
Monocytes Relative: 10.9 % (ref 3.0–12.0)
Neutro Abs: 1.8 10*3/uL (ref 1.4–7.7)
Neutrophils Relative %: 39.3 % — ABNORMAL LOW (ref 43.0–77.0)
Platelets: 190 10*3/uL (ref 150.0–400.0)
RBC: 4.2 Mil/uL (ref 3.87–5.11)
RDW: 13.5 % (ref 11.5–15.5)
WBC: 4.5 10*3/uL (ref 4.0–10.5)

## 2022-08-02 LAB — COMPREHENSIVE METABOLIC PANEL
ALT: 14 U/L (ref 0–35)
AST: 17 U/L (ref 0–37)
Albumin: 4 g/dL (ref 3.5–5.2)
Alkaline Phosphatase: 56 U/L (ref 39–117)
BUN: 12 mg/dL (ref 6–23)
CO2: 28 mEq/L (ref 19–32)
Calcium: 9.1 mg/dL (ref 8.4–10.5)
Chloride: 106 mEq/L (ref 96–112)
Creatinine, Ser: 0.82 mg/dL (ref 0.40–1.20)
GFR: 75.32 mL/min (ref >60.00)
Glucose, Bld: 89 mg/dL (ref 70–99)
Potassium: 4 mEq/L (ref 3.5–5.1)
Sodium: 140 mEq/L (ref 135–145)
Total Bilirubin: 0.4 mg/dL (ref 0.2–1.2)
Total Protein: 6.6 g/dL (ref 6.0–8.3)

## 2022-08-02 LAB — CK: Total CK: 186 U/L — ABNORMAL HIGH (ref 7–177)

## 2022-08-02 LAB — URINALYSIS
Bilirubin Urine: NEGATIVE
Hgb urine dipstick: NEGATIVE
Ketones, ur: NEGATIVE
Leukocytes,Ua: NEGATIVE
Nitrite: NEGATIVE
Specific Gravity, Urine: 1.01 (ref 1.000–1.030)
Total Protein, Urine: NEGATIVE
Urine Glucose: NEGATIVE
Urobilinogen, UA: 0.2 (ref 0.0–1.0)
pH: 6.5 (ref 5.0–8.0)

## 2022-08-02 LAB — TSH: TSH: 2.11 u[IU]/mL (ref 0.35–5.50)

## 2022-08-02 NOTE — Patient Instructions (Signed)
It was great to see you!  Please recheck your blood pressure when you go home and after rest - send me a mychart message so we know what it is please I have ordered a bone density scan - you can schedule this at the front desk on your way out I will place sports medicine referral for your thigh pain. They will call you.  An order for xray has been put in for you. To have this done, you can walk in at the [facility] location without a scheduled appointment.  The address is [address]. It is across the street from [facility]. Lab and x-xray are located in the basement.   Hours of operation are M-F 8:30am to 5:00pm.  Please note that they are closed for lunch between 12:30 and 1:00pm.  A referral has been placed for you to see one of our fantastic providers at [place]. Someone from their office will be in touch soon regarding scheduling your appointment.  Their location:  [place] [facility]  [address] on the 1st floor Phone number [phone] Fax [phone].   This location is across the street from the entrance to [employer] and in the same complex as the [facility]  Please go to the lab for blood work.   Our office will call you with your results unless you have chosen to receive results via MyChart.  If your blood work is normal we will follow-up each year for physicals and as scheduled for chronic medical problems.  If anything is abnormal we will treat accordingly and get you in for a follow-up.  Take care,  Aldona Bar

## 2022-08-02 NOTE — Progress Notes (Signed)
Subjective:    Claudia Adkins is a 66 y.o. female and is here for a comprehensive physical exam.  HPI  Health Maintenance Due  Topic Date Due   DEXA SCAN  07/04/2022   MAMMOGRAM  07/31/2022    Acute Concerns: Right leg pain Lateral right thigh pain which has been intermittent. Pain started again x1 week ago. Pain occurred x2 years ago as well.  Seems to coincide with R low back pain. Deep achy pain. Denies redness, warmth, swelling. Not tender to palpation. No particular triggers for pain. She had an elevated CK of 193 in 2021 when she had the pain previously. No family history of bone cancer.  Chronic Issues: Hyperlipidemia Not on any medications for this. Lab Results  Component Value Date   CHOL 211 (H) 07/18/2021   HDL 68.80 07/18/2021   LDLCALC 132 (H) 07/18/2021   TRIG 51.0 07/18/2021   CHOLHDL 3 07/18/2021    Osteopenia Taking multivitamin which includes Vitamin D and Calcium.  DEXA 07/04/2020:  Lumbar spine L1-L4 Femoral neck (FN) 33% distal radius   T-score -2.3 RFN: -2.0 LFN: -2.3 n/a  Change in BMD from previous DXA test (%) n/a n/a n/a    Anemia Doing well with po multivitamin. Lab Results  Component Value Date   IRON 54 07/18/2021   TIBC 338.8 07/18/2021   FERRITIN 74.5 07/18/2021    Lab Results  Component Value Date   WBC 3.3 (L) 11/29/2021   HGB 13.4 11/29/2021   HCT 40.4 11/29/2021   MCV 91.5 11/29/2021   PLT 171.0 11/29/2021    Unintentional weight loss Continues to lose weight. Denies any night sweats, hemoptysis, or shortness of breath. She is UTD on all cancer screenings except mammo which is scheduled. She does walk at least 5 miles daily. Wt Readings from Last 10 Encounters:  08/02/22 162 lb 8 oz (73.7 kg)  11/29/21 165 lb 9.6 oz (75.1 kg)  07/18/21 166 lb 2 oz (75.4 kg)  03/31/21 166 lb 12.8 oz (75.7 kg)  02/10/21 168 lb 3.2 oz (76.3 kg)  09/19/20 180 lb (81.6 kg)  08/26/20 180 lb (81.6 kg)  07/01/20 180 lb (81.6  kg)  05/11/20 183 lb 6.1 oz (83.2 kg)  06/26/19 191 lb (86.6 kg)     Elevated blood pressure reading States that she has been up all night working and has not slept yet. Does not check BP at home. Denies chest pain, SOB, LE swelling. BP Readings from Last 3 Encounters:  08/02/22 (!) 140/90  11/29/21 106/64  07/18/21 126/78     Health Maintenance: Immunizations -- UTD Tdap last completed 2015 Colonoscopy -- UTD last completed 2022 Mammogram -- Last completed 07/31/2021 no evidence of malignancy, scheduled for repeat mammo 08/2022 PAP -- N/A s/p hysterectomy Bone Density -- Due, last completed 06/2020 Diet -- Well balanced Exercise -- Active lifestyle, walks daily up to 5 miles with her dog    Sleep habits -- Sleeping well, working night shift Mood -- Doing well, no concerns  UTD with dentist? - Yes UTD with eye doctor? - Yes  Weight history: Wt Readings from Last 10 Encounters:  08/02/22 162 lb 8 oz (73.7 kg)  11/29/21 165 lb 9.6 oz (75.1 kg)  07/18/21 166 lb 2 oz (75.4 kg)  03/31/21 166 lb 12.8 oz (75.7 kg)  02/10/21 168 lb 3.2 oz (76.3 kg)  09/19/20 180 lb (81.6 kg)  08/26/20 180 lb (81.6 kg)  07/01/20 180 lb (81.6 kg)  05/11/20 183  lb 6.1 oz (83.2 kg)  06/26/19 191 lb (86.6 kg)   Body mass index is 27.04 kg/m. No LMP recorded. Patient has had a hysterectomy.  Alcohol use:  reports current alcohol use.  Tobacco use:  Tobacco Use: Medium Risk (08/02/2022)   Patient History    Smoking Tobacco Use: Former    Smokeless Tobacco Use: Never    Passive Exposure: Not on file   Eligible for lung cancer screening? no     08/02/2022   10:00 AM  Depression screen PHQ 2/9  Decreased Interest 0  Down, Depressed, Hopeless 0  PHQ - 2 Score 0     Other providers/specialists: Patient Care Team: Inda Coke, [place] as PCP - General (Physician Assistant)    PMHx, SurgHx, SocialHx, Medications, and Allergies were reviewed in the Visit Navigator and updated as  appropriate.   Past Medical History:  Diagnosis Date   Allergy    Anemia    Osteopenia      Past Surgical History:  Procedure Laterality Date   ABDOMINAL HYSTERECTOMY     BREAST BIOPSY Right    1999? benign   CESAREAN SECTION       Family History  Problem Relation Age of Onset   Hyperlipidemia Mother    Hypertension Mother    Dementia Mother    Diabetes Mother    Cirrhosis Father    Alcohol abuse Father    HIV Brother    Breast cancer Neg Hx    Colon cancer Neg Hx    Pancreatic cancer Neg Hx    Esophageal cancer Neg Hx    Colon polyps Neg Hx    Rectal cancer Neg Hx    Stomach cancer Neg Hx     Social History   Tobacco Use   Smoking status: Former    Packs/day: 0.50    Years: 10.00    Total pack years: 5.00    Types: Cigarettes   Smokeless tobacco: Never   Tobacco comments:    quit 30 years ago  Vaping Use   Vaping Use: Never used  Substance Use Topics   Alcohol use: Yes    Comment: occ   Drug use: No    Review of Systems:   Review of Systems  Constitutional:  Positive for weight loss. Negative for chills, fever and malaise/fatigue.  HENT:  Negative for hearing loss, sinus pain and sore throat.   Respiratory:  Negative for cough, hemoptysis and shortness of breath.   Cardiovascular:  Negative for chest pain, palpitations, leg swelling and PND.  Gastrointestinal:  Negative for abdominal pain, constipation, diarrhea, heartburn, nausea and vomiting.  Genitourinary:  Negative for dysuria, frequency and urgency.  Musculoskeletal:  Positive for myalgias. Negative for back pain and neck pain.  Skin:  Negative for itching and rash.  Neurological:  Negative for dizziness, tingling, seizures and headaches.  Endo/Heme/Allergies:  Negative for polydipsia.  Psychiatric/Behavioral:  Negative for depression. The patient is not nervous/anxious.     Objective:   BP (!) 140/90 (BP Location: Left Arm, Patient Position: Sitting, Cuff Size: Normal)   Pulse 60    Temp 98.2 F (36.8 C) (Temporal)   Ht '5\' 5"'$  (1.651 m)   Wt 162 lb 8 oz (73.7 kg)   SpO2 99%   BMI 27.04 kg/m  Body mass index is 27.04 kg/m.   General Appearance:    Alert, cooperative, no distress, appears stated age  Head:    Normocephalic, without obvious abnormality, atraumatic  Eyes:  PERRL, conjunctiva/corneas clear, EOM's intact, fundi    benign, both eyes  Ears:    Normal TM's and external ear canals, both ears  Nose:   Nares normal, septum midline, mucosa normal, no drainage    or sinus tenderness  Throat:   Lips, mucosa, and tongue normal; teeth and gums normal  Neck:   Supple, symmetrical, trachea midline, no adenopathy;    thyroid:  no enlargement/tenderness/nodules; no carotid   bruit or JVD  Back:     Symmetric, no curvature, ROM normal, no CVA tenderness  Lungs:     Clear to auscultation bilaterally, respirations unlabored  Chest Wall:    No tenderness or deformity   Heart:    Regular rate and rhythm, S1 and S2 normal, no murmur, rub or gallop  Breast Exam:    Deferred  Abdomen:     Soft, non-tender, bowel sounds active all four quadrants,    no masses, no organomegaly  Genitalia:    Deferred  Extremities:   Extremities normal, atraumatic, no cyanosis or edema No tenderness to R thigh  Pulses:   2+ and symmetric all extremities  Skin:   Skin color, texture, turgor normal, no rashes or lesions  Lymph nodes:   Cervical, supraclavicular, and axillary nodes normal  Neurologic:   CNII-XII intact, normal strength, sensation and reflexes    throughout    Assessment/Plan:   Routine physical examination Today patient counseled on age appropriate routine health concerns for screening and prevention, each reviewed and up to date or declined. Immunizations reviewed and up to date or declined. Labs ordered and reviewed. Risk factors for depression reviewed and negative. Hearing function and visual acuity are intact. ADLs screened and addressed as needed. Functional  ability and level of safety reviewed and appropriate. Education, counseling and referrals performed based on assessed risks today. Patient provided with a copy of personalized plan for preventive services.  Hyperlipidemia, unspecified hyperlipidemia type Update lipid panel and provide recommendations accordingly  Osteopenia, unspecified location Update DEXA  Overweight Continue healthy efforts  Unintentional weight loss Will add CXR and UA May be related to very active lifestyle Continue to monitor weight at home  Consider CT chest/abd/pel if persists Follow-up in 3-6 mo  Right thigh pain No red flags Will recheck CK Refer to sports medicine Possible piriformis syndrome?   I,Alexis Herring,acting as a Education administrator for [employer], PA.,have documented all relevant documentation on the behalf of Inda Coke, PA,as directed by  Inda Coke, PA while in the presence of Inda Coke, [place].  I, Inda Coke, [place], have reviewed all documentation for this visit. The documentation on 08/02/22 for the exam, diagnosis, procedures, and orders are all accurate and complete.    Inda Coke, PA-C [place]

## 2022-08-03 ENCOUNTER — Other Ambulatory Visit (INDEPENDENT_AMBULATORY_CARE_PROVIDER_SITE_OTHER): Payer: BC Managed Care – PPO

## 2022-08-03 ENCOUNTER — Encounter: Payer: Self-pay | Admitting: Physician Assistant

## 2022-08-03 ENCOUNTER — Other Ambulatory Visit: Payer: BC Managed Care – PPO

## 2022-08-03 DIAGNOSIS — E785 Hyperlipidemia, unspecified: Secondary | ICD-10-CM | POA: Diagnosis not present

## 2022-08-03 LAB — LIPID PANEL
Cholesterol: 208 mg/dL — ABNORMAL HIGH (ref 0–200)
HDL: 66.4 mg/dL (ref >39.00)
LDL Cholesterol: 133 mg/dL — ABNORMAL HIGH (ref 0–99)
NonHDL: 141.62
Total CHOL/HDL Ratio: 3
Triglycerides: 44 mg/dL (ref 0.0–149.0)
VLDL: 8.8 mg/dL (ref 0.0–40.0)

## 2022-08-10 NOTE — Progress Notes (Unsigned)
   I, Peterson Lombard, LAT, ATC acting as a scribe for Lynne Leader, MD.  Subjective:    CC: R thigh pain  HPI: Pt is a 66 y/o female c/o R thigh pain  Pertinent review of Systems: ***  Relevant historical information: ***   Objective:   There were no vitals filed for this visit. General: Well Developed, well nourished, and in no acute distress.   MSK: ***  Lab and Radiology Results No results found for this or any previous visit (from the past 72 hour(s)). No results found.    Impression and Recommendations:    Assessment and Plan: 66 y.o. female with ***.  PDMP not reviewed this encounter. No orders of the defined types were placed in this encounter.  No orders of the defined types were placed in this encounter.   Discussed warning signs or symptoms. Please see discharge instructions. Patient expresses understanding.   ***

## 2022-08-15 ENCOUNTER — Ambulatory Visit: Payer: BC Managed Care – PPO | Admitting: Family Medicine

## 2022-08-15 ENCOUNTER — Ambulatory Visit (INDEPENDENT_AMBULATORY_CARE_PROVIDER_SITE_OTHER): Payer: BC Managed Care – PPO

## 2022-08-15 VITALS — BP 144/88 | HR 65 | Ht 65.0 in | Wt 167.0 lb

## 2022-08-15 DIAGNOSIS — M79651 Pain in right thigh: Secondary | ICD-10-CM

## 2022-08-15 DIAGNOSIS — M7061 Trochanteric bursitis, right hip: Secondary | ICD-10-CM | POA: Diagnosis not present

## 2022-08-15 NOTE — Patient Instructions (Addendum)
Thank you for coming in today.   Please get an Xray today before you leave   I've referred you to Physical Therapy.  Let us know if you don't hear from them in one week.   I think you probably have trochanteric bursitis and or hip abductor tendonitis.   I think Meralgia Paraesthetica is less likely but not impossible.  If not better we can get more invasive or annoying with the diagnosis or treatment.   Recheck in about 6 weeks.   If you are all better ok to cancel the recheck.

## 2022-08-16 NOTE — Progress Notes (Signed)
Right hip x-ray shows no fractures.  No severe arthritis.

## 2022-08-16 NOTE — Progress Notes (Signed)
Lumbar spine x-ray shows some arthritis changes.

## 2022-08-22 ENCOUNTER — Encounter: Payer: Self-pay | Admitting: Physical Therapy

## 2022-08-22 ENCOUNTER — Ambulatory Visit (INDEPENDENT_AMBULATORY_CARE_PROVIDER_SITE_OTHER): Payer: BC Managed Care – PPO | Admitting: Physical Therapy

## 2022-08-22 ENCOUNTER — Ambulatory Visit: Payer: BC Managed Care – PPO | Admitting: Physical Therapy

## 2022-08-22 DIAGNOSIS — M79651 Pain in right thigh: Secondary | ICD-10-CM | POA: Diagnosis not present

## 2022-08-22 DIAGNOSIS — M25551 Pain in right hip: Secondary | ICD-10-CM | POA: Diagnosis not present

## 2022-08-22 DIAGNOSIS — M6281 Muscle weakness (generalized): Secondary | ICD-10-CM | POA: Diagnosis not present

## 2022-08-22 NOTE — Therapy (Addendum)
OUTPATIENT PHYSICAL THERAPY LOWER EXTREMITY EVALUATION   Patient Name: MARVETTA VOHS MRN: [id number] DOB:[birth year], 67 y.o., female Today's Date: 08/22/2022  END OF SESSION:  PT End of Session - 08/22/22 1120     Visit Number 1    Number of Visits 12    Date for PT Re-Evaluation 10/05/22    PT Start Time 1118    PT Stop Time 1150    PT Time Calculation (min) 32 min    Activity Tolerance Patient tolerated treatment well    Behavior During Therapy WFL for tasks assessed/performed             Past Medical History:  Diagnosis Date   Allergy    Anemia    Osteopenia    Past Surgical History:  Procedure Laterality Date   ABDOMINAL HYSTERECTOMY     BREAST BIOPSY Right    1999? benign   CESAREAN SECTION     Patient Active Problem List   Diagnosis Date Noted   Constipation 06/26/2022   Flatulence, eructation and gas pain 06/26/2022   Overweight 07/01/2020   Hyperlipidemia 07/01/2020   Osteopenia 07/01/2020   Vitamin D deficiency 07/01/2020   Carpal tunnel syndrome of right wrist 06/13/2017   Osteoarthritis of spine with radiculopathy, cervical region 06/13/2017    PCP: Inda Coke, PA   REFERRING PROVIDER:  Gregor Hams, MD   REFERRING DIAG: [phone] (ICD-10-CM) - Right thigh pain M70.61 (ICD-10-CM) - Trochanteric bursitis of right hip  THERAPY DIAG:  Pain in right hip  Pain in right thigh  Muscle weakness (generalized)  Rationale for Evaluation and Treatment: Rehabilitation  ONSET DATE: 2 weeks ago  SUBJECTIVE:   SUBJECTIVE STATEMENT: Pt arriving today with intermittent pain in Rt hip and Rt thigh which began about 2 weeks ago  PERTINENT HISTORY: Allergies, anemia, osteopenia  PAIN:  NPRS scale: 3/10 Pain location: Rt hip, Rt Thigh Pain description: burning sensation Aggravating factors: walking at times,getting up after prolonged sitting    PRECAUTIONS: None  WEIGHT BEARING RESTRICTIONS: No  FALLS:  Has patient fallen in last 6  months? 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 Frontal view of the left hip unremarkable. No acute displaced fracture or diastasis bones of the pelvis. There is no evidence of severe arthropathy or other focal bone abnormality.   Degenerative changes of visualized lower lumbar spine.   PATIENT SURVEYS:  08/22/22: FOTO intake:   79%  COGNITION: Overall cognitive status: WFL    SENSATION: WFL    MUSCLE LENGTH: Hamstrings: Right 74 deg; Left 85 deg   POSTURE: rounded shoulders and forward head  PALPATION: TTP along Rt IT band and Rt vastus lateralis  LOWER EXTREMITY ROM:   ROM Right eval Left eval  Hip flexion 105 110  Hip extension    Hip abduction 35 38  Hip adduction    Hip internal rotation    Hip external rotation    Knee flexion    Knee extension    Ankle dorsiflexion    Ankle plantarflexion    Ankle inversion    Ankle eversion     (Blank rows = not tested)  LOWER EXTREMITY MMT:  MMT Right eval Left eval  Hip flexion 4/5 5/5  Hip extension    Hip abduction 4/5 5/5  Hip adduction 4/5 5/5  Hip internal rotation    Hip external rotation    Knee flexion 4/5 5/5  Knee extension 5/5 5/5  Ankle dorsiflexion 5/5 5/5  Ankle plantarflexion 5/5 5/5  Ankle inversion    Ankle eversion     (Blank rows = not tested)    FUNCTIONAL TESTS:  08/22/22:  5 time sit to stand: 15 seconds no UE support   GAIT: 08/22/22:  Distance walked: 25 feet  Assistive device utilized: None Level of assistance: Complete Independence    TODAY'S TREATMENT                                                                           DATE: 09/04/22:  TherEx:  Nustep:  Standing hip extension 2 x 10 c UE support Standing hip abduction 2 x 10 c UE support     08/22/22 Therex:    HEP instruction/performance c cues for techniques, handout provided.  Trial set performed of each for comprehension and symptom assessment.  See below for exercise list  PATIENT EDUCATION:  Education details: HEP, POC Person educated: Patient Education method: Explanation, Demonstration, Verbal cues, and Handouts Education comprehension: verbalized understanding, returned demonstration, and verbal cues required  HOME EXERCISE PROGRAM: Access Code: [email or web address] URL: [email or web address] Date: 08/22/2022 Prepared by: Kearney Hard  Exercises - Standing Hip Hiking  - 1-2 x daily - 7 x weekly - 10-15 reps - Supine ITB Stretch with Strap  - 1-2 x daily - 7 x weekly - 3 reps - 20-30 seconds hold - Supine Piriformis Stretch with Foot on Ground  - 1-2 x daily - 7 x weekly - 3 reps - 20-30 seconds hold - Supine Figure 4 Piriformis Stretch  - 1-2 x daily - 7 x weekly - 3 sets - 3 reps - 20-30  seconds hold  ASSESSMENT:  CLINICAL IMPRESSION: Patient is a 67 y.o. who comes to clinic with complaints of Rt hip pain dx of trochanteric bursitis and Rt thigh pain with mobility, strength and movement coordination deficits that impair their ability to perform usual daily and recreational functional activities without increase difficulty/symptoms at this time.  Patient to benefit from skilled PT services to address impairments and limitations to improve to previous level of function without restriction secondary to condition.   OBJECTIVE IMPAIRMENTS: decreased mobility, difficulty walking, decreased ROM, decreased strength, increased muscle spasms, and pain.   ACTIVITY LIMITATIONS: lifting, bending, sitting, standing, squatting, and sleeping  PARTICIPATION LIMITATIONS: community activity and occupation  PERSONAL FACTORS: see above are also  affecting patient's functional outcome.   REHAB POTENTIAL: Good  CLINICAL DECISION MAKING: Stable/uncomplicated  EVALUATION COMPLEXITY: Low   GOALS: Goals reviewed with patient? Yes  SHORT TERM GOALS: (target date for Short term goals are 3 weeks 09/14/22)   1.  Patient will demonstrate independent use of home exercise program to maintain progress from in clinic treatments.  Goal status: New  LONG TERM GOALS: (target dates for all long term goals are 6 weeks  10/05/22 )   1. Patient will demonstrate/report pain at worst less than or equal to 2/10  to facilitate minimal limitation in daily activity secondary to pain symptoms.  Goal status: New   2. Patient will demonstrate independent use of home exercise program to facilitate ability to maintain/progress functional gains from skilled physical therapy services.  Goal status: New   3. Patient will demonstrate FOTO outcome > or = 87 % to indicate reduced disability due to condition.  Goal status: New   4.  Patient will demonstrate Rt LE MMT 5/5 throughout to faciltiate usual transfers, stairs, squatting at [facility] for daily life.   Goal status: New   5.  Patient will demonstrate lifting 20# from floor to counter height without LE pain using correct body mechanics.  Goal status: New   6.  Pt will be able to report working a full shift with pain </= 2/10 in her Rt LE.  Goal status: New      PLAN:  PT FREQUENCY: 1-2x/week  PT DURATION: 6  PLANNED INTERVENTIONS: Therapeutic exercises, Therapeutic activity, Neuro Muscular re-education, Balance training, Gait training, Patient/Family education, Joint mobilization, Stair training, DME instructions, Dry Needling, Electrical stimulation, Traction, Cryotherapy, vasopneumatic deviceMoist heat, Taping, Ultrasound, Ionotophoresis '4mg'$ /ml Dexamethasone, and Manual therapy.  All included unless contraindicated  PLAN FOR NEXT SESSION: Review HEP knowledge/results, hip hikes, ER and IR hip  stretching, LE strengthening, Try DN at next visit to Rt quad, hip flexor stretch and quad stetch         Oretha Caprice, PT, MPT 08/22/2022, 11:21 AM

## 2022-08-28 ENCOUNTER — Encounter: Payer: BC Managed Care – PPO | Admitting: Physical Therapy

## 2022-09-04 ENCOUNTER — Ambulatory Visit: Payer: BC Managed Care – PPO | Admitting: Physical Therapy

## 2022-09-04 ENCOUNTER — Encounter: Payer: Self-pay | Admitting: Physical Therapy

## 2022-09-04 ENCOUNTER — Encounter: Payer: BC Managed Care – PPO | Admitting: Physical Therapy

## 2022-09-04 DIAGNOSIS — M25551 Pain in right hip: Secondary | ICD-10-CM

## 2022-09-04 DIAGNOSIS — M79651 Pain in right thigh: Secondary | ICD-10-CM | POA: Diagnosis not present

## 2022-09-04 DIAGNOSIS — M6281 Muscle weakness (generalized): Secondary | ICD-10-CM

## 2022-09-04 NOTE — Therapy (Addendum)
OUTPATIENT PHYSICAL THERAPY LOWER EXTREMITY TREATMENT NOTE   Patient Name: Claudia Adkins MRN: [id number] DOB:Jul 25, 1956, 67 y.o., female Today's Date: 09/04/2022  END OF SESSION:  PT End of Session - 09/04/22 1402     Visit Number 2    Number of Visits 12    Date for PT Re-Evaluation 10/05/22    PT Start Time 1350    PT Stop Time 1430    PT Time Calculation (min) 40 min    Activity Tolerance Patient tolerated treatment well    Behavior During Therapy WFL for tasks assessed/performed             Past Medical History:  Diagnosis Date   Allergy    Anemia    Osteopenia    Past Surgical History:  Procedure Laterality Date   ABDOMINAL HYSTERECTOMY     BREAST BIOPSY Right    1999? benign   CESAREAN SECTION     Patient Active Problem List   Diagnosis Date Noted   Constipation 06/26/2022   Flatulence, eructation and gas pain 06/26/2022   Overweight 07/01/2020   Hyperlipidemia 07/01/2020   Osteopenia 07/01/2020   Vitamin D deficiency 07/01/2020   Carpal tunnel syndrome of right wrist 06/13/2017   Osteoarthritis of spine with radiculopathy, cervical region 06/13/2017    PCP: Inda Coke, PA   REFERRING PROVIDER:  Gregor Hams, MD   REFERRING DIAG: [phone] (ICD-10-CM) - Right thigh pain M70.61 (ICD-10-CM) - Trochanteric bursitis of right hip  THERAPY DIAG:  Pain in right hip  Pain in right thigh  Muscle weakness (generalized)  Rationale for Evaluation and Treatment: Rehabilitation  ONSET DATE: 2 weeks ago  SUBJECTIVE:   SUBJECTIVE STATEMENT: Pt arriving today 3/10 reporting dull achy feeling in her Rt hip upon arrival.   PERTINENT HISTORY: Allergies, anemia, osteopenia  PAIN:  NPRS scale: 3/10 Pain location: Rt hip, Rt Thigh Pain description: burning sensation Aggravating factors: walking at times,getting up after prolonged sitting    PRECAUTIONS: None  WEIGHT BEARING RESTRICTIONS: No  FALLS:  Has patient fallen in last 6 months?  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 Frontal view of the left hip unremarkable. No acute displaced fracture or diastasis bones of the pelvis. There is no evidence of severe arthropathy or other focal bone abnormality.   Degenerative changes of visualized lower lumbar spine.   PATIENT SURVEYS:  08/22/22: FOTO intake:   79%  COGNITION: Overall cognitive status: WFL    SENSATION: WFL    MUSCLE LENGTH: Hamstrings: Right 74 deg; Left 85 deg   POSTURE: rounded shoulders and forward head  PALPATION: TTP along Rt IT band and Rt vastus lateralis  LOWER EXTREMITY ROM:   ROM Right eval Left eval  Hip flexion 105 110  Hip extension    Hip abduction 35 38  Hip adduction    Hip internal rotation    Hip external rotation    Knee flexion    Knee extension    Ankle dorsiflexion    Ankle plantarflexion    Ankle inversion    Ankle eversion     (Blank rows = not tested)  LOWER EXTREMITY MMT:  MMT Right  eval Left eval  Hip flexion 4/5 5/5  Hip extension    Hip abduction 4/5 5/5  Hip adduction 4/5 5/5  Hip internal rotation    Hip external rotation    Knee flexion 4/5 5/5  Knee extension 5/5 5/5  Ankle dorsiflexion 5/5 5/5  Ankle plantarflexion 5/5 5/5  Ankle inversion    Ankle eversion     (Blank rows = not tested)    FUNCTIONAL TESTS:  08/22/22:  5 time sit to stand: 15 seconds no UE support   GAIT: 08/22/22:  Distance walked: 25 feet  Assistive device utilized: None Level of assistance: Complete Independence    TODAY'S TREATMENT                                                                           DATE: 09/04/22:  TherEx:  Supine hamstring sets 2 x 10 holding 5 sec Supine: bridge x 10 Supine Bridge c clam shelll level 3 band 2 x 10 Supine SLR: 2 x 10  Supine trunk rotation x 3 to each side holding 20 sec Sidelying hip abduction 2 x 10  Manual:  IASTM to right hip/glutes/IT band Percussion to left hip/glutes/IT band    08/22/22 Therex:    HEP instruction/performance c cues for techniques, handout provided.  Trial set performed of each for comprehension and symptom assessment.  See below for exercise list  PATIENT EDUCATION:  Education details: Discussed DN and demonstrated to pt, handout issued previously. Person educated: Patient Education method: Explanation, Demonstration, Verbal cues, and Handouts Education comprehension: verbalized understanding, returned demonstration, and verbal cues required  HOME EXERCISE PROGRAM: Access Code: [email or web address] URL: [email or web address] Date: 08/22/2022 Prepared by: Kearney Hard  Exercises - Standing Hip Hiking  - 1-2 x daily - 7 x weekly - 10-15 reps - Supine ITB Stretch with Strap  - 1-2 x daily - 7 x weekly - 3 reps - 20-30 seconds hold - Supine Piriformis Stretch with Foot on Ground  - 1-2 x daily - 7 x weekly - 3 reps - 20-30 seconds hold - Supine Figure 4 Piriformis Stretch  - 1-2 x daily - 7 x weekly - 3 sets - 3 reps - 20-30  seconds hold  ASSESSMENT:  CLINICAL IMPRESSION: Pt arriving today reporting 3/10 pain in her Rt hip/thigh. Pt stating she went for a massage yesterday and it seemed to help. Pt tolerating all exercises well. Pt's initial HEP reviewed. We discussed DN, but feels she would like more time to think about the treatment intervention. Continue skilled PT to maximize pt's function.   OBJECTIVE IMPAIRMENTS: decreased mobility, difficulty walking, decreased ROM, decreased strength, increased muscle spasms, and pain.   ACTIVITY LIMITATIONS: lifting, bending, sitting, standing, squatting, and  sleeping  PARTICIPATION LIMITATIONS: community activity and occupation  PERSONAL FACTORS: see above are also affecting patient's functional outcome.   REHAB POTENTIAL: Good  CLINICAL DECISION MAKING: Stable/uncomplicated  EVALUATION COMPLEXITY: Low   GOALS: Goals reviewed with patient? Yes  SHORT TERM GOALS: (target date for Short term goals are 3 weeks 09/14/22)   1.  Patient will demonstrate independent use of home exercise program to maintain progress from in clinic treatments.  Goal status: New  LONG TERM GOALS: (target dates  for all long term goals are 6 weeks  10/05/22 )   1. Patient will demonstrate/report pain at worst less than or equal to 2/10 to facilitate minimal limitation in daily activity secondary to pain symptoms.  Goal status: New   2. Patient will demonstrate independent use of home exercise program to facilitate ability to maintain/progress functional gains from skilled physical therapy services.  Goal status: New   3. Patient will demonstrate FOTO outcome > or = 87 % to indicate reduced disability due to condition.  Goal status: New   4.  Patient will demonstrate Rt LE MMT 5/5 throughout to faciltiate usual transfers, stairs, squatting at [facility] for daily life.   Goal status: New   5.  Patient will demonstrate lifting 20# from floor to counter height without LE pain using correct body mechanics.  Goal status: New   6.  Pt will be able to report working a full shift with pain </= 2/10 in her Rt LE.  Goal status: New      PLAN:  PT FREQUENCY: 1-2x/week  PT DURATION: 6  PLANNED INTERVENTIONS: Therapeutic exercises, Therapeutic activity, Neuro Muscular re-education, Balance training, Gait training, Patient/Family education, Joint mobilization, Stair training, DME instructions, Dry Needling, Electrical stimulation, Traction, Cryotherapy, vasopneumatic deviceMoist heat, Taping, Ultrasound, Ionotophoresis '4mg'$ /ml Dexamethasone, and Manual therapy.  All  included unless contraindicated  PLAN FOR NEXT SESSION: Review HEP knowledge/results, hip hikes, ER and IR hip stretching, LE strengthening, Consider DN at next visit to Rt quad, hip flexor stretch and quad stretch.         Oretha Caprice, PT, MPT 09/04/2022, 2:06 PM

## 2022-09-10 ENCOUNTER — Ambulatory Visit
Admission: RE | Admit: 2022-09-10 | Discharge: 2022-09-10 | Disposition: A | Discharge status: Home / Self Care | Payer: BC Managed Care – PPO | Source: Ambulatory Visit | Attending: Physician Assistant | Admitting: Physician Assistant

## 2022-09-10 DIAGNOSIS — Z1231 Encounter for screening mammogram for malignant neoplasm of breast: Secondary | ICD-10-CM

## 2022-09-11 ENCOUNTER — Encounter: Payer: BC Managed Care – PPO | Admitting: Physical Therapy

## 2022-09-14 ENCOUNTER — Encounter: Payer: Self-pay | Admitting: Physical Therapy

## 2022-09-14 ENCOUNTER — Ambulatory Visit: Payer: BC Managed Care – PPO | Admitting: Physical Therapy

## 2022-09-14 DIAGNOSIS — M79651 Pain in right thigh: Secondary | ICD-10-CM

## 2022-09-14 DIAGNOSIS — M25551 Pain in right hip: Secondary | ICD-10-CM

## 2022-09-14 DIAGNOSIS — M6281 Muscle weakness (generalized): Secondary | ICD-10-CM

## 2022-09-14 NOTE — Therapy (Addendum)
OUTPATIENT PHYSICAL THERAPY LOWER EXTREMITY TREATMENT NOTE  [facility]   Patient Name: Claudia Adkins MRN: [id number] DOB:27-Nov-1955, 67 y.o., female Today's Date: 09/14/2022  END OF SESSION:  PT End of Session - 09/14/22 0938     Visit Number 3    Number of Visits 12    Date for PT Re-Evaluation 10/05/22    PT Start Time [phone]   pt arrived late   PT Stop Time 1012    PT Time Calculation (min) 35 min    Activity Tolerance Patient tolerated treatment well    Behavior During Therapy [facility] for tasks assessed/performed              Past Medical History:  Diagnosis Date   Allergy    Anemia    Osteopenia    Past Surgical History:  Procedure Laterality Date   ABDOMINAL HYSTERECTOMY     BREAST BIOPSY Right    1999? benign   CESAREAN SECTION     Patient Active Problem List   Diagnosis Date Noted   Constipation 06/26/2022   Flatulence, eructation and gas pain 06/26/2022   Overweight 07/01/2020   Hyperlipidemia 07/01/2020   Osteopenia 07/01/2020   Vitamin D deficiency 07/01/2020   Carpal tunnel syndrome of right wrist 06/13/2017   Osteoarthritis of spine with radiculopathy, cervical region 06/13/2017    PCP: Inda Coke, PA   REFERRING PROVIDER:  Gregor Hams, MD   REFERRING DIAG: [phone] (ICD-10-CM) - Right thigh pain M70.61 (ICD-10-CM) - Trochanteric bursitis of right hip  THERAPY DIAG:  Pain in right hip  Pain in right thigh  Muscle weakness (generalized)  Rationale for Evaluation and Treatment: Rehabilitation  ONSET DATE: 2 weeks ago  SUBJECTIVE:   SUBJECTIVE STATEMENT: Pain is less achy, but does still report some soreness.  PERTINENT HISTORY: Allergies, anemia, osteopenia  PAIN:  NPRS scale: no pain, just soreness/10 Pain location: Rt hip, Rt Thigh Pain description: burning sensation Aggravating factors: walking at times,getting up after prolonged sitting    PRECAUTIONS: None  WEIGHT BEARING RESTRICTIONS: No  FALLS:  Has patient  fallen in last 6 months? No  LIVING ENVIRONMENT: Lives with: lives with their family Lives in: House/apartment Stairs: Yes: External: 7 steps; on right going up   OCCUPATION: nurse at [employer]: Stop hurting, work without pain     OBJECTIVE:   DIAGNOSTIC FINDINGS:  FINDINGS:08/15/22 There is no evidence of hip fracture or dislocation of the right hip. Frontal view of the left hip unremarkable. No acute displaced fracture or diastasis bones of the pelvis. There is no evidence of severe arthropathy or other focal bone abnormality.   Degenerative changes of visualized lower lumbar spine.   PATIENT SURVEYS:  08/22/22: FOTO intake:   79%  MUSCLE LENGTH: 08/22/22: Hamstrings: Right 74 deg; Left 85 deg   POSTURE: rounded shoulders and forward head  PALPATION: TTP along Rt IT band and Rt vastus lateralis  LOWER EXTREMITY ROM:   ROM Right eval Left eval  Hip flexion 105 110  Hip extension    Hip abduction 35 38  Hip adduction    Hip internal rotation    Hip external rotation    Knee flexion    Knee extension    Ankle dorsiflexion    Ankle plantarflexion    Ankle inversion    Ankle eversion     (Blank rows = not tested)  LOWER EXTREMITY MMT:  MMT Right eval Left eval  Hip flexion 4/5  5/5  Hip extension    Hip abduction 4/5 5/5  Hip adduction 4/5 5/5  Hip internal rotation    Hip external rotation    Knee flexion 4/5 5/5  Knee extension 5/5 5/5  Ankle dorsiflexion 5/5 5/5  Ankle plantarflexion 5/5 5/5  Ankle inversion    Ankle eversion     (Blank rows = not tested)    FUNCTIONAL TESTS:  08/22/22:  5 time sit to stand: 15 seconds no UE support   GAIT: 08/22/22:  Distance walked: 25 feet  Assistive device utilized: None Level of assistance: Complete Independence    TODAY'S TREATMENT                                                                          DATE: 09/14/22 TherEx Supine hamstring sets 2 x  10 holding 5 sec Supine bridge with strap for isometric hip abduction 2x10 Sidelying hip circles x 10 reps CW/CCW, 2 sets Rt lateral lunge 2x10; 12 # KB Rt SLS with LLE in slight abduction and Rt hip hike, 2x10; 5 sec hold Rt piriformis stretch 3x30 sec bil  Manual IASTM with percussive device to Rt hip/glutes/IT band   09/04/22:  TherEx:  Supine hamstring sets 2 x 10 holding 5 sec Supine: bridge x 10 Supine Bridge c clam shelll level 3 band 2 x 10 Supine SLR: 2 x 10  Supine trunk rotation x 3 to each side holding 20 sec Sidelying hip abduction 2 x 10  Manual:  IASTM to let hip/glutes/IT band Percussion to left hip/glutes/IT band    08/22/22 Therex:    HEP instruction/performance c cues for techniques, handout provided.  Trial set performed of each for comprehension and symptom assessment.  See below for exercise list  PATIENT EDUCATION:  Education details: Discussed DN and demonstrated to pt, handout issued previously. Person educated: Patient Education method: Explanation, Demonstration, Verbal cues, and Handouts Education comprehension: verbalized understanding, returned demonstration, and verbal cues required  HOME EXERCISE PROGRAM: Access Code: [email or web address] URL: [email or web address] Date: 08/22/2022 Prepared by: Kearney Hard  Exercises - Standing Hip Hiking  - 1-2 x daily - 7 x weekly - 10-15 reps - Supine ITB Stretch with Strap  - 1-2 x daily - 7 x weekly - 3 reps - 20-30 seconds hold - Supine Piriformis Stretch with Foot on Ground  - 1-2 x daily - 7 x weekly - 3 reps - 20-30 seconds hold - Supine Figure 4 Piriformis Stretch  - 1-2 x daily - 7 x weekly - 3 sets - 3 reps - 20-30  seconds hold  ASSESSMENT:  CLINICAL IMPRESSION: Pt tolerated session well today and trial of progressive strengthening exercises today.  Will continue to benefit from PT to maximize function.  OBJECTIVE IMPAIRMENTS: decreased mobility, difficulty walking, decreased ROM,  decreased strength, increased muscle spasms, and pain.   ACTIVITY LIMITATIONS: lifting, bending, sitting, standing, squatting, and sleeping  PARTICIPATION LIMITATIONS: community activity and occupation  PERSONAL FACTORS: see above are also affecting patient's functional outcome.   REHAB POTENTIAL: Good  CLINICAL DECISION MAKING: Stable/uncomplicated  EVALUATION COMPLEXITY: Low   GOALS: Goals reviewed with patient? Yes  SHORT TERM GOALS: (target date for Short term goals are 3 weeks 09/14/22)  1.  Patient will demonstrate independent use of home exercise program to maintain progress from in clinic treatments.  Goal status: MET 09/14/22  LONG TERM GOALS: (target dates for all long term goals are 6 weeks  10/05/22 )   1. Patient will demonstrate/report pain at worst less than or equal to 2/10 to facilitate minimal limitation in daily activity secondary to pain symptoms.  Goal status: New   2. Patient will demonstrate independent use of home exercise program to facilitate ability to maintain/progress functional gains from skilled physical therapy services.  Goal status: New   3. Patient will demonstrate FOTO outcome > or = 87 % to indicate reduced disability due to condition.  Goal status: New   4.  Patient will demonstrate Rt LE MMT 5/5 throughout to faciltiate usual transfers, stairs, squatting at [facility] for daily life.   Goal status: New   5.  Patient will demonstrate lifting 20# from floor to counter height without LE pain using correct body mechanics.  Goal status: New   6.  Pt will be able to report working a full shift with pain </= 2/10 in her Rt LE.  Goal status: New      PLAN:  PT FREQUENCY: 1-2x/week  PT DURATION: 6 weeks  PLANNED INTERVENTIONS: Therapeutic exercises, Therapeutic activity, Neuro Muscular re-education, Balance training, Gait training, Patient/Family education, Joint mobilization, Stair training, DME instructions, Dry Needling, Electrical  stimulation, Traction, Cryotherapy, vasopneumatic deviceMoist heat, Taping, Ultrasound, Ionotophoresis 4mg /ml Dexamethasone, and Manual therapy.  All included unless contraindicated  PLAN FOR NEXT SESSION: Glute med strengthening, hip hikes, ER and IR hip stretching, LE strengthening, Consider DN at next visit to Rt quad, hip flexor stretch and quad stretch (deferred DN as pt improving)    Laureen Abrahams, PT, DPT 09/14/22 10:12 AM   PHYSICAL THERAPY DISCHARGE SUMMARY  Visits from Start of Care: 3  Current functional level related to goals / functional outcomes: See note   Remaining deficits: See note   Education / Equipment: HEP  Patient goals were partially met. Patient is being discharged due to not returning since the last visit.  Scot Jun, PT, DPT, OCS, ATC 11/08/22  1:33 PM

## 2022-09-18 ENCOUNTER — Encounter: Payer: BC Managed Care – PPO | Admitting: Physical Therapy

## 2022-09-25 NOTE — Progress Notes (Unsigned)
   I, Peterson Lombard, LAT, ATC acting as a scribe for Lynne Leader, MD.  Claudia Adkins is a 67 y.o. female who presents to [facility] today for 6-week follow-up right lateral hip and thigh pain.  Patient was last seen by Dr. Georgina Snell on 08/15/2022 and was referred to PT, completing 3 visits.  Today, patient reports R hip and thigh pain has improved w/ PT, 90% better.  Dx imaging: 08/15/2022 L spine & right hip x-ray  Pertinent review of systems: No fevers or chills  Relevant historical information: Osteopenia   Exam:  BP 130/84   Pulse (!) 59   Ht '5\' 5"'$  (1.651 m)   Wt 164 lb (74.4 kg)   SpO2 98%   BMI 27.29 kg/m  General: Well Developed, well nourished, and in no acute distress.   MSK: Right hip normal-appearing Nontender. Normal motion. Hip abduction and external rotation strength are diminished 4+/5.       Assessment and Plan: 67 y.o. female with right lateral hip pain improved with physical therapy.  Pain thought to be due to trochanteric bursitis/hip abductor tendinopathy.  She has had 90% improvement with her 3 physical therapy sessions.  At this point she is on home exercise program and has no further physical therapy sessions scheduled.  She seems to be doing great.  Plan to continue home exercise program.  Will be very easy to reschedule formal PT in the future if she needs reteaching at some point.   Discussed warning signs or symptoms. Please see discharge instructions. Patient expresses understanding.   The above documentation has been reviewed and is accurate and complete Lynne Leader, M.D.

## 2022-09-27 ENCOUNTER — Ambulatory Visit: Payer: BC Managed Care – PPO | Admitting: Family Medicine

## 2022-09-27 VITALS — BP 130/84 | HR 59 | Ht 65.0 in | Wt 164.0 lb

## 2022-09-27 DIAGNOSIS — M7061 Trochanteric bursitis, right hip: Secondary | ICD-10-CM

## 2022-09-27 NOTE — Patient Instructions (Signed)
Thank you for coming in today.   So glad you are feeling better!  Continue working on home exercises  Check back as needed

## 2022-12-21 ENCOUNTER — Ambulatory Visit: Payer: BC Managed Care – PPO | Admitting: Family Medicine

## 2022-12-21 ENCOUNTER — Encounter: Payer: Self-pay | Admitting: Family Medicine

## 2022-12-21 VITALS — BP 140/60 | HR 75 | Temp 97.8°F | Resp 16 | Ht 65.0 in | Wt 165.2 lb

## 2022-12-21 DIAGNOSIS — H6123 Impacted cerumen, bilateral: Secondary | ICD-10-CM

## 2022-12-21 NOTE — Progress Notes (Signed)
   Subjective:     Patient ID: Claudia Adkins, female    DOB: 07/06/1956, 67 y.o.   MRN: [id number]  Chief Complaint  Patient presents with   Ear Fullness    Both ears feel clogged up, affecting hearing, started 3 or 4 days ago     HPI Past few days, ears feel clogged and decreased hearing.  Having some allergies but that was 1.5 weeks ago.  No fevers/chills. Taking zyrtec/claritin.  No drainage.   Health Maintenance Due  Topic Date Due   DEXA SCAN  07/04/2022    Past Medical History:  Diagnosis Date   Allergy    Anemia    Osteopenia     Past Surgical History:  Procedure Laterality Date   ABDOMINAL HYSTERECTOMY     BREAST BIOPSY Right    1999? benign   CESAREAN SECTION       Current Outpatient Medications:    Multiple Vitamin (MULTIVITAMIN) tablet, Take 1 tablet by mouth daily., Disp: , Rfl:   Allergies  Allergen Reactions   Sulfa Antibiotics Other (See Comments)    Bad body aches   Sulfamethoxazole-Trimethoprim Rash   ROS neg/noncontributory except as noted HPI/below      Objective:     BP (!) 140/60   Pulse 75   Temp 97.8 F (36.6 C) (Temporal)   Resp 16   Ht 5\' 5"  (1.651 m)   Wt 165 lb 4 oz (75 kg)   SpO2 98%   BMI 27.50 kg/m  Wt Readings from Last 3 Encounters:  12/21/22 165 lb 4 oz (75 kg)  09/27/22 164 lb (74.4 kg)  08/15/22 167 lb (75.8 kg)    Physical Exam   Gen: WDWN NAD HEENT: NCAT, conjunctiva not injected, sclera nonicteric B ears-impacted w/hard wax in canal EXT:  no edema MSK: no gross abnormalities.  NEURO: A&O x3.  CN II-XII intact.  PSYCH: normal mood. Good eye contact  Procedure note:  verbal informed consent obtained.  Irrig both ears w/H2O2 and water.  Very hard wax.  Used currette to get small piece of hard wax from left ear.  Spent working on ears.  Wax started to soften but couldn't safely reach w/currette.  Patient tolerated well.      Assessment & Plan:  Impacted cerumen of both ears   Impacted  cerumen of both ears-spent over 30 minutes working on ears.  Didn't feel safe continuing.  Will have patient use debrox(ear wax removal kit) over the weekend and return next week to finish.    Angelena Sole, MD

## 2022-12-21 NOTE — Patient Instructions (Signed)
Use ear wax removal kit or debrox to loosen wax.

## 2022-12-24 ENCOUNTER — Ambulatory Visit (INDEPENDENT_AMBULATORY_CARE_PROVIDER_SITE_OTHER): Payer: BC Managed Care – PPO | Admitting: Physician Assistant

## 2022-12-24 VITALS — BP 161/87 | HR 57 | Temp 97.5°F | Ht 65.0 in | Wt 238.6 lb

## 2022-12-24 DIAGNOSIS — R21 Rash and other nonspecific skin eruption: Secondary | ICD-10-CM | POA: Diagnosis not present

## 2022-12-24 DIAGNOSIS — H6123 Impacted cerumen, bilateral: Secondary | ICD-10-CM | POA: Diagnosis not present

## 2022-12-24 MED ORDER — KETOCONAZOLE 2 % EX CREA: 1.0000 | TOPICAL_CREAM | Freq: Every day | CUTANEOUS | 0 refills | Status: DC

## 2022-12-24 NOTE — Progress Notes (Signed)
Claudia Adkins is a 67 y.o. female here for a follow up of a pre-existing problem.  History of Present Illness:   Chief Complaint  Patient presents with   Ear Fullness    Pt co bilateral ear fullness but no pain. Present for 5 days. Has tried debrox which did not help.     Ear Fullness     Cerumen impaction Was seen on 12/21/22 here in our office for cerumen impaction Staff spent >30 minutes trying to clean out her ears without relief She was instructed to use Debrox over the weekend and return for cleaning and she is here today She reports using q-tips regularly   Rash Reports rash under bilateral breasts x 2-3 days Has not tried anything for this Can be tender at times No discharge, fevers, chills   Past Medical History:  Diagnosis Date   Allergy    Anemia    Osteopenia      Social History   Tobacco Use   Smoking status: Former    Packs/day: 0.50    Years: 10.00    Additional pack years: 0.00    Total pack years: 5.00    Types: Cigarettes   Smokeless tobacco: Never   Tobacco comments:    quit 30 years ago  Vaping Use   Vaping Use: Never used  Substance Use Topics   Alcohol use: Yes    Comment: occ   Drug use: No    Past Surgical History:  Procedure Laterality Date   ABDOMINAL HYSTERECTOMY     BREAST BIOPSY Right    1999? benign   CESAREAN SECTION      Family History  Problem Relation Age of Onset   Hyperlipidemia Mother    Hypertension Mother    Dementia Mother    Diabetes Mother    Cirrhosis Father    Alcohol abuse Father    HIV Brother    Breast cancer Neg Hx    Colon cancer Neg Hx    Pancreatic cancer Neg Hx    Esophageal cancer Neg Hx    Colon polyps Neg Hx    Rectal cancer Neg Hx    Stomach cancer Neg Hx     Allergies  Allergen Reactions   Sulfa Antibiotics Other (See Comments)    Bad body aches   Sulfamethoxazole-Trimethoprim Rash    Current Medications:   Current Outpatient Medications:    ketoconazole (NIZORAL) 2 %  cream, Apply 1 Application topically daily., Disp: 15 g, Rfl: 0   Multiple Vitamin (MULTIVITAMIN) tablet, Take 1 tablet by mouth daily., Disp: , Rfl:    Review of Systems:   ROS Negative unless otherwise specified per HPI.  Vitals:   Vitals:   12/24/22 0923  BP: (!) 161/87  Pulse: (!) 57  Temp: (!) 97.5 F (36.4 C)  TempSrc: Temporal  SpO2: 100%  Weight: 238 lb 9.6 oz (108.2 kg)  Height: 5\' 5"  (1.651 m)     Body mass index is 39.71 kg/m.  Physical Exam:   Physical Exam Vitals and nursing note reviewed.  Constitutional:      General: She is not in acute distress.    Appearance: She is well-developed. She is not ill-appearing or toxic-appearing.  HENT:     Head: Normocephalic and atraumatic.     Right Ear: Tympanic membrane, ear canal and external ear normal. There is impacted cerumen.     Left Ear: Tympanic membrane, ear canal and external ear normal. There is impacted cerumen.  Nose: Nose normal.     Right Sinus: No maxillary sinus tenderness or frontal sinus tenderness.     Left Sinus: No maxillary sinus tenderness or frontal sinus tenderness.     Mouth/Throat:     Pharynx: Uvula midline. No posterior oropharyngeal erythema.  Eyes:     General: Lids are normal.     Conjunctiva/sclera: Conjunctivae normal.  Neck:     Trachea: Trachea normal.  Cardiovascular:     Rate and Rhythm: Normal rate and regular rhythm.     Heart sounds: Normal heart sounds, S1 normal and S2 normal.  Pulmonary:     Effort: Pulmonary effort is normal.     Breath sounds: Normal breath sounds. No decreased breath sounds, wheezing, rhonchi or rales.  Lymphadenopathy:     Cervical: No cervical adenopathy.  Skin:    General: Skin is warm and dry.     Comments: Erythematous, macerated skin under bilateral breasts without lesions or pustules  Neurological:     Mental Status: She is alert.  Psychiatric:        Speech: Speech normal.        Behavior: Behavior normal. Behavior is  cooperative.    Ceruminosis is noted.  Wax is removed by syringing and manual debridement. Instructions for home care to prevent wax buildup are given.   Assessment and Plan:   Impacted cerumen of both ears Tolerated procedure well Endorse improvement of symptoms after procedure No evidence of infection on my exam requiring antibiotics  Rash No red flags Suspect candidal intertrigo Start ketoconazole 1-2 times daily under area Keep area dry and consider use of zinc oxide as needed once healed to prevent return Follow-up with any concerns   Jarold Motto, PA-C

## 2023-04-11 IMAGING — MG MM DIGITAL SCREENING BILAT W/ TOMO AND CAD
8 series · 8 of 24 positions shown · non-contrast
Comparison: Previous exam(s).

CLINICAL DATA: Screening.

EXAM:
DIGITAL SCREENING BILATERAL MAMMOGRAM WITH TOMOSYNTHESIS AND CAD
TECHNIQUE: Bilateral screening digital craniocaudal and mediolateral oblique
mammograms were obtained. Bilateral screening digital breast
tomosynthesis was performed. The images were evaluated with
computer-aided detection.

[R MLO synth-2D]
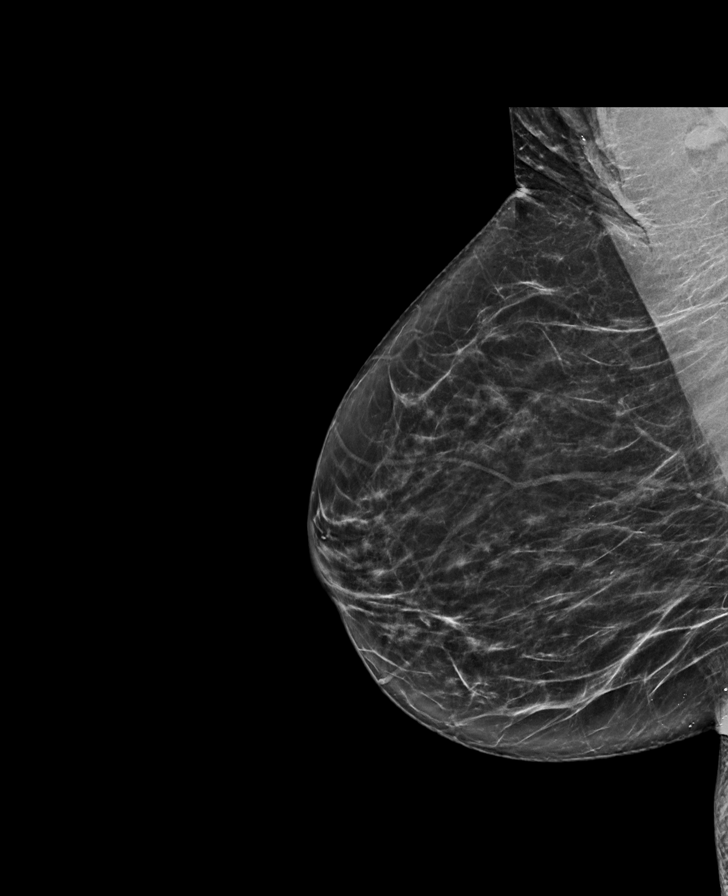

[R CC synth-2D]
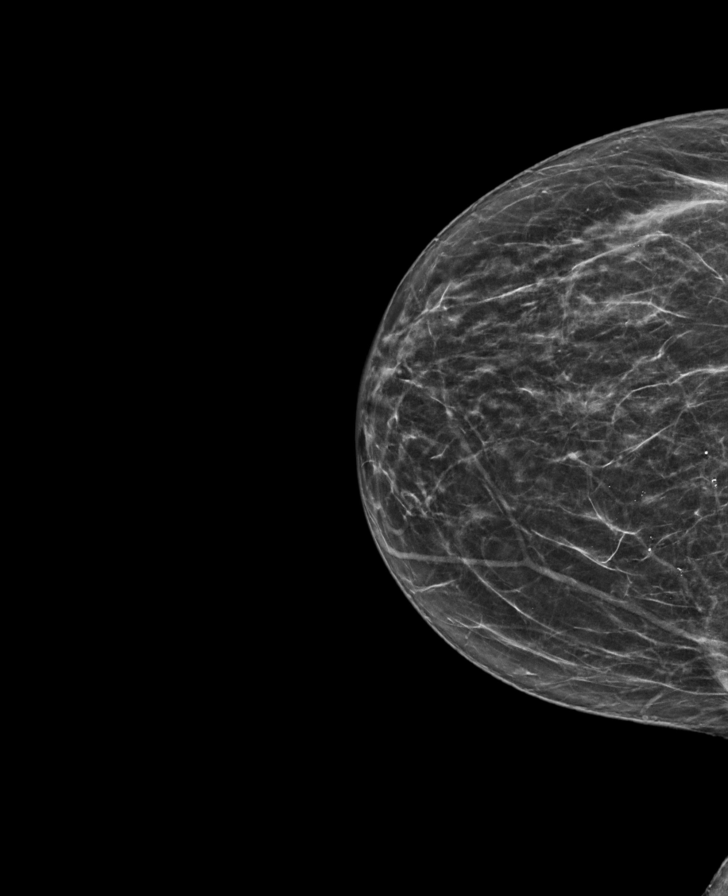

[L MLO synth-2D]
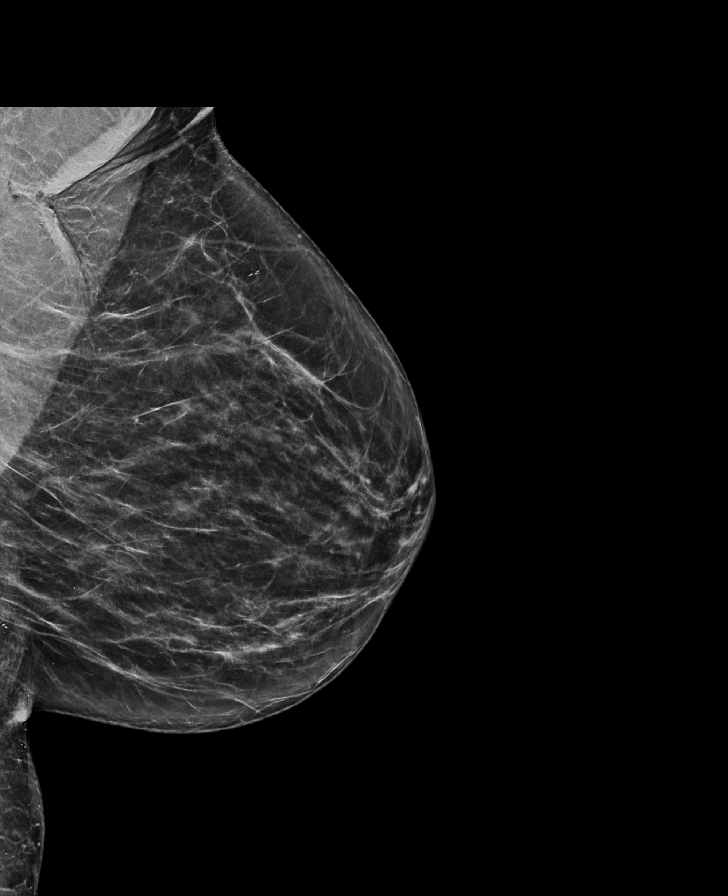

[L CC synth-2D]
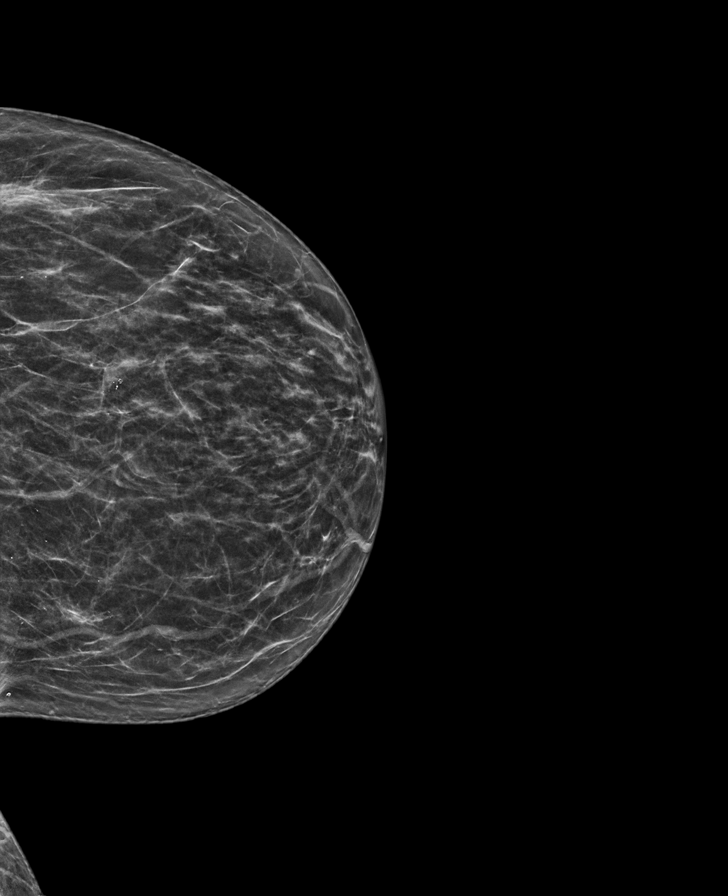

[L CC tomo · tomo slice 29/57.0]
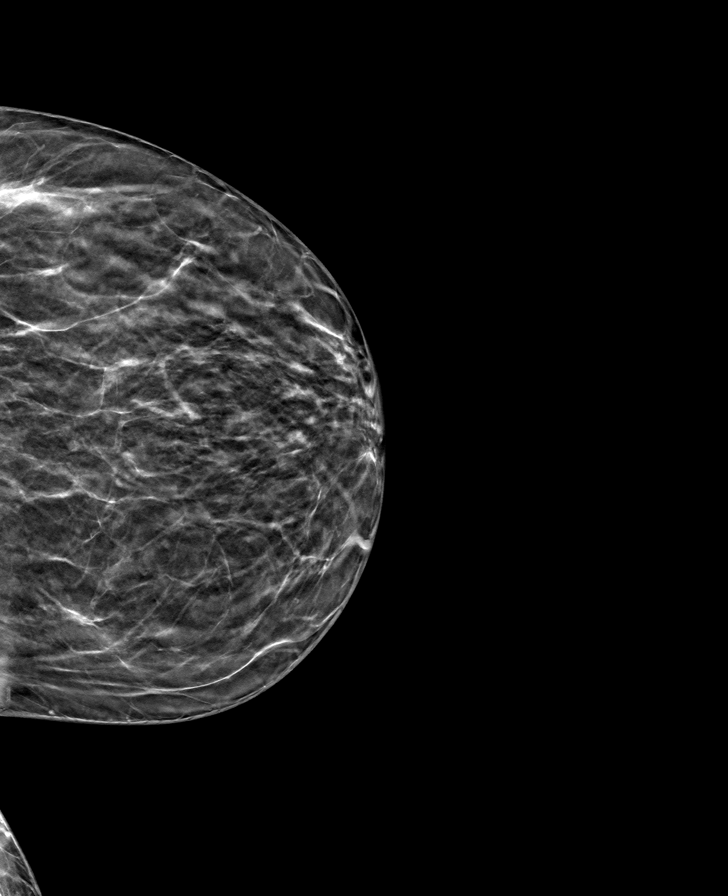

[L MLO tomo · tomo slice 35/70.0]
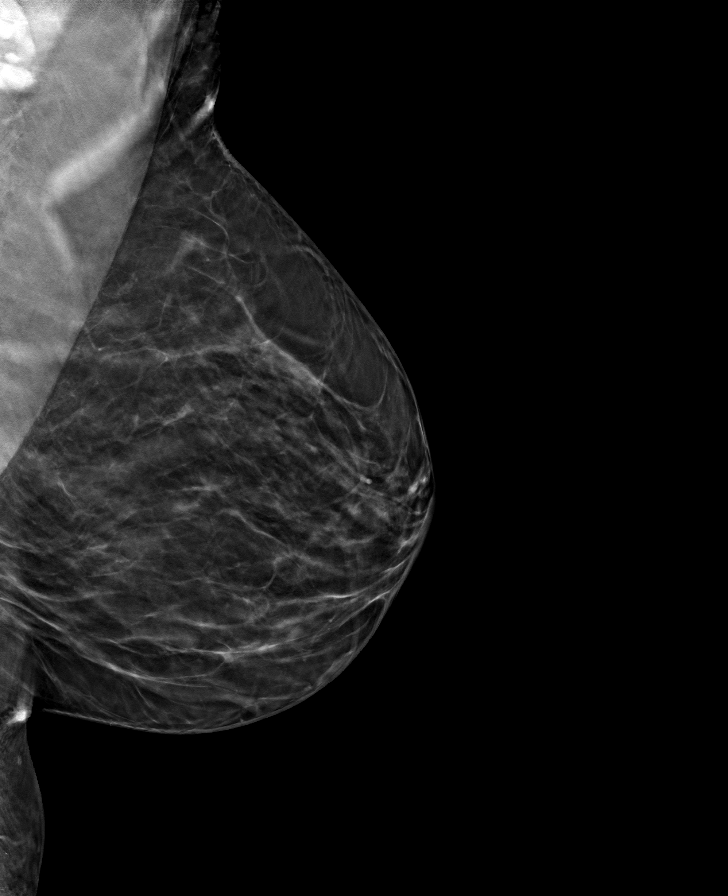

[R CC tomo · tomo slice 31/60.0]
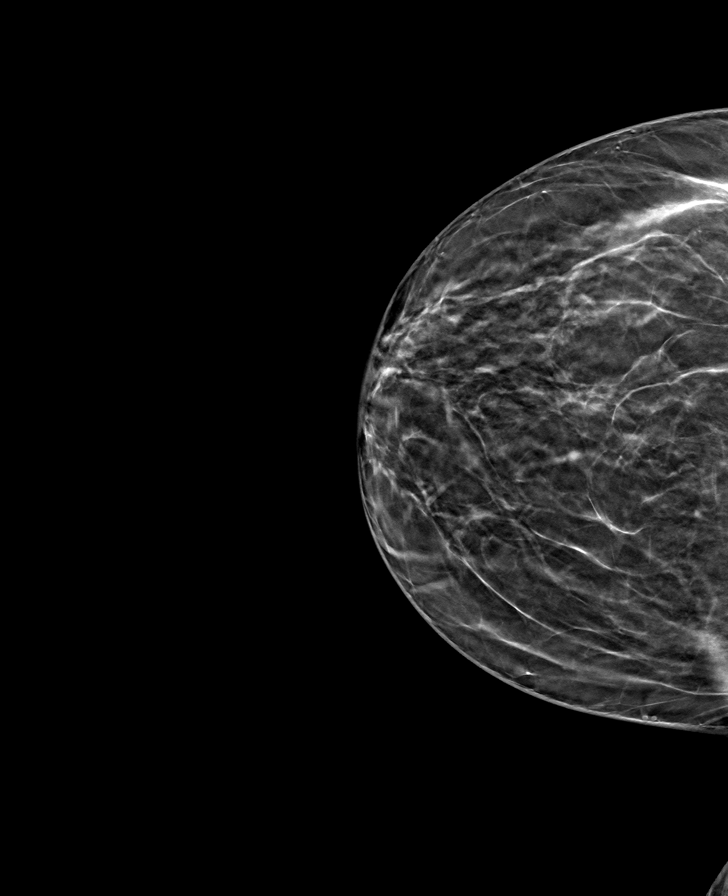

[R MLO tomo · tomo slice 35/70.0]
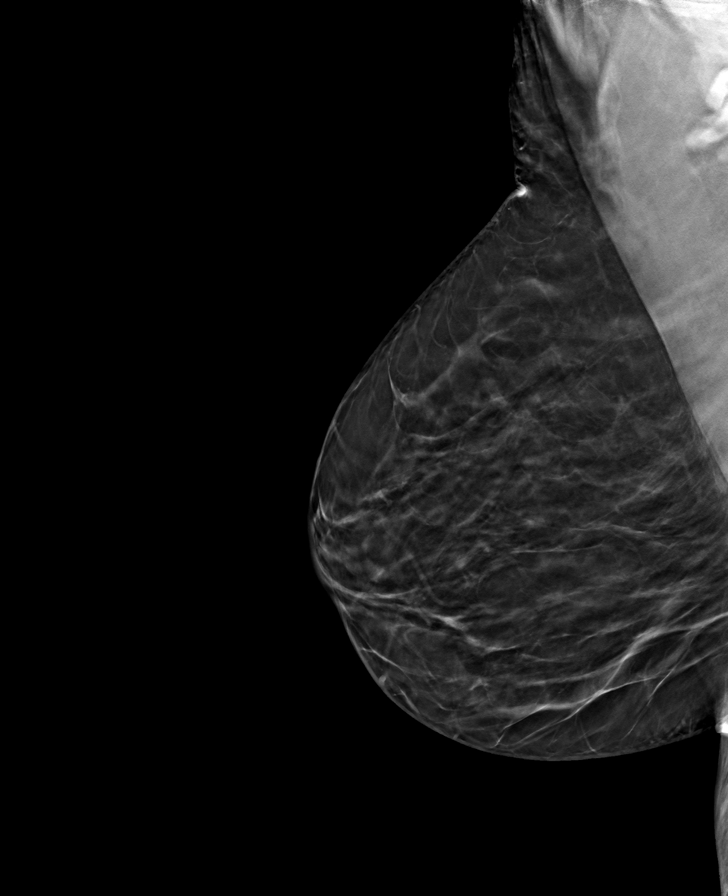

[8 of 24 positions shown; findings below may reference images not displayed]

ACR Breast Density Category b: There are scattered areas of
fibroglandular density.
FINDINGS: There are no findings suspicious for malignancy.
IMPRESSION: No mammographic evidence of malignancy. A result letter of this
screening mammogram will be mailed directly to the patient.

RECOMMENDATION:
Screening mammogram in one year. (Code:51-O-LD2)

BI-RADS CATEGORY  1: Negative.

## 2023-05-07 ENCOUNTER — Encounter: Payer: Self-pay | Admitting: Physician Assistant

## 2023-05-07 ENCOUNTER — Ambulatory Visit: Payer: BC Managed Care – PPO | Admitting: Physician Assistant

## 2023-05-07 VITALS — BP 140/90 | HR 76 | Temp 97.3°F | Ht 65.0 in | Wt 164.2 lb

## 2023-05-07 DIAGNOSIS — H6123 Impacted cerumen, bilateral: Secondary | ICD-10-CM

## 2023-05-07 DIAGNOSIS — I10 Essential (primary) hypertension: Secondary | ICD-10-CM | POA: Diagnosis not present

## 2023-05-07 NOTE — Progress Notes (Signed)
Claudia Adkins is a 67 y.o. female here for a follow up of a pre-existing problem.  History of Present Illness:   Chief Complaint  Patient presents with   Cerumen Impaction    Pt c/o bilateral ear discomfort, trouble hearing.    HPI  Impacted Cerumen (Bilateral): Presents for re-occurrence of cerumen impaction.  Endorses ear discomfort and difficulty hearing; worse on the left.  Previously managed by Dr. Dillard Cannon.   Elevated blood pressure Currently not taking medication. At home blood pressure readings are: not checked. Patient denies chest pain, SOB, blurred vision, dizziness, unusual headaches, lower leg swelling. Denies excessive caffeine intake, stimulant usage, excessive alcohol intake, or increase in salt consumption.  BP Readings from Last 3 Encounters:  05/07/23 (!) 140/90  12/24/22 (!) 161/87  12/21/22 (!) 140/60     Past Medical History:  Diagnosis Date   Allergy    Anemia    Osteopenia      Social History   Tobacco Use   Smoking status: Former    Current packs/day: 0.50    Average packs/day: 0.5 packs/day for 10.0 years (5.0 ttl pk-yrs)    Types: Cigarettes   Smokeless tobacco: Never   Tobacco comments:    quit 30 years ago  Vaping Use   Vaping status: Never Used  Substance Use Topics   Alcohol use: Yes    Comment: occ   Drug use: No    Past Surgical History:  Procedure Laterality Date   ABDOMINAL HYSTERECTOMY     BREAST BIOPSY Right    1999? benign   CESAREAN SECTION      Family History  Problem Relation Age of Onset   Hyperlipidemia Mother    Hypertension Mother    Dementia Mother    Diabetes Mother    Cirrhosis Father    Alcohol abuse Father    HIV Brother    Breast cancer Neg Hx    Colon cancer Neg Hx    Pancreatic cancer Neg Hx    Esophageal cancer Neg Hx    Colon polyps Neg Hx    Rectal cancer Neg Hx    Stomach cancer Neg Hx     Allergies  Allergen Reactions   Sulfa Antibiotics Other (See Comments)    Bad  body aches   Sulfamethoxazole-Trimethoprim Rash    Current Medications:   Current Outpatient Medications:    Multiple Vitamin (MULTIVITAMIN) tablet, Take 1 tablet by mouth daily., Disp: , Rfl:    Review of Systems:   ROS See pertinent positives and negatives as per the HPI.  Vitals:   Vitals:   05/07/23 1008  BP: (!) 140/90  Pulse: 76  Temp: (!) 97.3 F (36.3 C)  TempSrc: Temporal  SpO2: 98%  Weight: 164 lb 4 oz (74.5 kg)  Height: 5\' 5"  (1.651 m)     Body mass index is 27.33 kg/m.  Physical Exam:   Physical Exam Vitals and nursing note reviewed.  Constitutional:      General: She is not in acute distress.    Appearance: She is well-developed. She is not ill-appearing or toxic-appearing.  HENT:     Head: Normocephalic and atraumatic.     Right Ear: Ear canal and external ear normal. There is impacted cerumen.     Left Ear: Tympanic membrane, ear canal and external ear normal. There is impacted cerumen.     Nose: Nose normal.     Right Sinus: No maxillary sinus tenderness or frontal sinus tenderness.  Left Sinus: No maxillary sinus tenderness or frontal sinus tenderness.     Mouth/Throat:     Pharynx: Uvula midline. No posterior oropharyngeal erythema.  Eyes:     General: Lids are normal.     Conjunctiva/sclera: Conjunctivae normal.  Neck:     Trachea: Trachea normal.  Cardiovascular:     Rate and Rhythm: Normal rate and regular rhythm.     Pulses: Normal pulses.     Heart sounds: Normal heart sounds, S1 normal and S2 normal.  Pulmonary:     Effort: Pulmonary effort is normal.     Breath sounds: Normal breath sounds. No decreased breath sounds, wheezing, rhonchi or rales.  Lymphadenopathy:     Cervical: No cervical adenopathy.  Skin:    General: Skin is warm and dry.  Neurological:     Mental Status: She is alert.     GCS: GCS eye subscore is 4. GCS verbal subscore is 5. GCS motor subscore is 6.  Psychiatric:        Speech: Speech normal.         Behavior: Behavior normal. Behavior is cooperative.     Assessment and Plan:   Impacted cerumen of both ears Ear lavage attempted but unsuccessful Will refer to ENT  Primary hypertension Above goal today No evidence of end-organ damage on my exam Recommend patient monitor home blood pressure at least a few times weekly If home monitoring shows consistent elevation, or any symptom(s) develop, recommend reach out to [place] for further advice on next steps  I,Emily Lagle,acting as a scribe for [employer], PA.,have documented all relevant documentation on the behalf of Jarold Motto, PA,as directed by  Jarold Motto, PA while in the presence of Jarold Motto, [place].  I, Jarold Motto, [place], have reviewed all documentation for this visit. The documentation on 05/07/23 for the exam, diagnosis, procedures, and orders are all accurate and complete.  Jarold Motto, PA-C

## 2023-06-13 ENCOUNTER — Encounter (INDEPENDENT_AMBULATORY_CARE_PROVIDER_SITE_OTHER): Payer: Self-pay

## 2023-06-13 ENCOUNTER — Ambulatory Visit (INDEPENDENT_AMBULATORY_CARE_PROVIDER_SITE_OTHER): Payer: BC Managed Care – PPO | Admitting: Audiology

## 2023-06-13 ENCOUNTER — Ambulatory Visit (INDEPENDENT_AMBULATORY_CARE_PROVIDER_SITE_OTHER): Payer: BC Managed Care – PPO | Admitting: Otolaryngology

## 2023-06-13 VITALS — Ht 70.0 in | Wt 166.6 lb

## 2023-06-13 DIAGNOSIS — H6123 Impacted cerumen, bilateral: Secondary | ICD-10-CM | POA: Diagnosis not present

## 2023-06-13 DIAGNOSIS — Z0110 Encounter for examination of ears and hearing without abnormal findings: Secondary | ICD-10-CM

## 2023-06-13 NOTE — Progress Notes (Signed)
Dear Dr. Bufford Buttner, Here is my assessment for our mutual patient, Claudia Adkins. Thank you for allowing me the opportunity to care for your patient. Please do not hesitate to contact me should you have any other questions. Sincerely, Dr. Jovita Kussmaul  Otolaryngology Clinic Note Referring provider: Dr. Bufford Buttner HPI:  Claudia Adkins is a 67 y.o. female kindly referred by Dr. Bufford Buttner for evaluation of bilateral cerumen impaction Off and on for past two years, ears feel clogged up. No other significant otologic symptoms or drainage or pain or vertigo. Has used Debrox and then flushed with saline without benefit.  Also tried ciprodex drops in June 2024 but not helpful. She feels like this affects her hearing. No significant sinonasal history. No prior ear surgery.   No significant sinonasal issues  PMHx: HTN  H&N Surgery: denies Personal or FHx of bleeding dz or anesthesia difficulty: no  Independent Review of Additional Tests or Records:  PCP notes reviewed 06/13/23 Audiogram was independently reviewed and interpreted by me and it reveals AU: normal hearing thresholds b/l; WRT 100% at 55 dB; type A tymps  SNHL= Sensorineural hearing loss   PMH/Meds/All/SocHx/FamHx/ROS:   Past Medical History:  Diagnosis Date   Allergy    Anemia    Osteopenia      Past Surgical History:  Procedure Laterality Date   ABDOMINAL HYSTERECTOMY     BREAST BIOPSY Right    1999? benign   CESAREAN SECTION      Family History  Problem Relation Age of Onset   Hyperlipidemia Mother    Hypertension Mother    Dementia Mother    Diabetes Mother    Cirrhosis Father    Alcohol abuse Father    HIV Brother    Breast cancer Neg Hx    Colon cancer Neg Hx    Pancreatic cancer Neg Hx    Esophageal cancer Neg Hx    Colon polyps Neg Hx    Rectal cancer Neg Hx    Stomach cancer Neg Hx      Social Connections: Not on file     Current Outpatient Medications:    Multiple Vitamin (MULTIVITAMIN) tablet, Take 1  tablet by mouth daily., Disp: , Rfl:    Physical Exam:   Ht 5\' 10"  (1.778 m)   Wt 166 lb 9.6 oz (75.6 kg)   BMI 23.90 kg/m    Salient findings:  CN II-XII intact Bilateral cerumen impaction; after impaction was removed, Bilateral EAC clear and TM intact with well pneumatized middle ear spaces Weber 512:  Rinne 512: AC > BC b/l  Rine 1024: AC > BC b/l  Anterior rhinoscopy: Septum relatively midline; bilateral inferior turbinates without significant hypertrophy No lesions of oral cavity/oropharynx; dentition intact - braces in place No obviously palpable neck masses/lymphadenopathy/thyromegaly No respiratory distress or stridor  Procedures:  Procedure: Bilateral ear microscopy and cerumen removal using microscope (CPT 69210) - Mod 50 Pre-procedure diagnosis: Bilateral Cerumen impaction bilateral external ears Post-procedure diagnosis: same Indication: Bilateral cerumen impaction; given patient's otologic complaints and history as well as for improved and comprehensive examination of external ear and tympanic membrane, bilateral otologic examination using microscope was performed and impacted cerumen removed  Procedure: Patient was placed semi-recumbent. Both ear canals were examined using the microscope with findings above. Cerumen removed on left and on right using suction and currette and alligators with improvement in EAC examination and patency. Left: EAC was patent. TM was intact . Middle ear was aerated. Drainage: no Right: EAC was patent. TM  was intact . Middle ear was aerated . Drainage: no Patient tolerated the procedure well.  Impression & Plans:  Claudia Adkins is a 67 y.o. female with: Bilateral subjective hearing loss - likely due to cerumen impaction Bilateral cerumen impaction - Impaction cleared today as it was likely affecting her hearing. Audio shows normal hearing - We discussed cerumen impaction strategies - will use mineral oil drops (4 drops twice weekly) as  needed   - f/u PRN   I have personally spent 50 minutes involved in face-to-face and non-face-to-face activities for this patient on the day of the visit.  Professional time spent includes the following activities, in addition to those noted in the documentation: preparing to see the patient (review of outside documentation), performing a medically appropriate examination and/or evaluation, counseling and educating the patient/family/caregiver, ordering medications, performing procedures (cerumen disimpaction), referring and communicating with other healthcare professionals, documenting clinical information in the electronic or other health record, independently interpreting results and communicating results with the patient/family/caregiver.    Thank you for allowing me the opportunity to care for your patient. Please do not hesitate to contact me should you have any other questions.  Sincerely, Jovita Kussmaul, MD Otolarynoglogist (ENT), [facility] Phone: [phone] Fax: [phone]  06/13/2023, 8:50 AM

## 2023-06-13 NOTE — Progress Notes (Signed)
  [address] [place], [place] [address] [phone]  Audiological Evaluation    Name: Claudia Adkins     DOB:   01-Oct-1955      MRN:   [id number]                                                                                     Service Date: 06/13/2023     Accompanied by: none   Patient comes today after Dr. Allena Katz, ENT sent a referral for a hearing evaluation due to concerns with  hearing loss and impacted wax in both ears.   Symptoms Yes Details  Hearing loss  [x]  Yes, it seems to be better since she started using debrox as per her PCP.  Tinnitus  []    Ear pain/ Ear infections  []    Balance problems  []    Noise exposure  []    Previous ear surgeries  []    Family history  []    Amplification  []    Other  []     Otoscopy: Right ear: Clear external ear canals and notable landmarks visualized on the tympanic membrane. Left ear:  Clear external ear canals and notable landmarks visualized on the tympanic membrane.  Tympanometry: Right ear: Type A- Normal external ear canal volume with normal middle ear pressure and tympanic membrane compliance Left ear: Type A- Normal external ear canal volume with normal middle ear pressure and tympanic membrane compliance  Pure tone Audiometry: Right ear- Normal hearing from [phone].  Left ear-  Normal hearing from [phone].   The hearing test results were completed under headphones and results are deemed to be of good reliability. Test technique:  conventional     Speech Audiometry: Right ear- Speech Reception Threshold (SRT) was obtained at 5 dBHL Left ear-Speech Reception Threshold (SRT) was obtained at 5 dBHL   Word Recognition Score Tested using NU-6 (MLV) Right ear: 100% was obtained at a presentation level of 55 dBHL with contralateral masking which is deemed as  excellent Left ear: 100% was obtained at a presentation level of 55 dBHL with contralateral masking which is deemed as  excellent     Impression: There is not a significant difference in pure-tone thresholds between ears.  There is not a significant difference in the word recognition score in between ears.    Recommendations: Return for a hearing evaluation if concerns with hearing changes arise or per MD recommendation.   Marvene Strohm MARIE LEROUX-MARTINEZ, AUD

## 2023-08-01 ENCOUNTER — Other Ambulatory Visit (HOSPITAL_COMMUNITY): Payer: Self-pay

## 2023-08-05 ENCOUNTER — Encounter: Payer: Self-pay | Admitting: Physician Assistant

## 2023-08-05 ENCOUNTER — Ambulatory Visit (INDEPENDENT_AMBULATORY_CARE_PROVIDER_SITE_OTHER): Payer: BC Managed Care – PPO | Admitting: Physician Assistant

## 2023-08-05 VITALS — BP 130/70 | HR 72 | Temp 96.9°F | Ht 70.0 in | Wt 169.0 lb

## 2023-08-05 DIAGNOSIS — E785 Hyperlipidemia, unspecified: Secondary | ICD-10-CM

## 2023-08-05 DIAGNOSIS — M8580 Other specified disorders of bone density and structure, unspecified site: Secondary | ICD-10-CM | POA: Diagnosis not present

## 2023-08-05 DIAGNOSIS — E559 Vitamin D deficiency, unspecified: Secondary | ICD-10-CM | POA: Diagnosis not present

## 2023-08-05 DIAGNOSIS — Z0000 Encounter for general adult medical examination without abnormal findings: Secondary | ICD-10-CM | POA: Diagnosis not present

## 2023-08-05 DIAGNOSIS — F5101 Primary insomnia: Secondary | ICD-10-CM

## 2023-08-05 DIAGNOSIS — I10 Essential (primary) hypertension: Secondary | ICD-10-CM

## 2023-08-05 LAB — CBC WITH DIFFERENTIAL/PLATELET
Basophils Absolute: 0 10*3/uL (ref 0.0–0.1)
Basophils Relative: 0.6 % (ref 0.0–3.0)
Eosinophils Absolute: 0.1 10*3/uL (ref 0.0–0.7)
Eosinophils Relative: 2.3 % (ref 0.0–5.0)
HCT: 37.2 % (ref 36.0–46.0)
Hemoglobin: 12.5 g/dL (ref 12.0–15.0)
Lymphocytes Relative: 32.1 % (ref 12.0–46.0)
Lymphs Abs: 1.4 10*3/uL (ref 0.7–4.0)
MCHC: 33.7 g/dL (ref 30.0–36.0)
MCV: 92.9 fL (ref 78.0–100.0)
Monocytes Absolute: 0.4 10*3/uL (ref 0.1–1.0)
Monocytes Relative: 9.6 % (ref 3.0–12.0)
Neutro Abs: 2.3 10*3/uL (ref 1.4–7.7)
Neutrophils Relative %: 55.4 % (ref 43.0–77.0)
Platelets: 195 10*3/uL (ref 150.0–400.0)
RBC: 4 Mil/uL (ref 3.87–5.11)
RDW: 13.1 % (ref 11.5–15.5)
WBC: 4.2 10*3/uL (ref 4.0–10.5)

## 2023-08-05 LAB — VITAMIN D 25 HYDROXY (VIT D DEFICIENCY, FRACTURES): VITD: 21.11 ng/mL — ABNORMAL LOW (ref 30.00–100.00)

## 2023-08-05 LAB — COMPREHENSIVE METABOLIC PANEL
ALT: 15 U/L (ref 0–35)
AST: 15 U/L (ref 0–37)
Albumin: 3.9 g/dL (ref 3.5–5.2)
Alkaline Phosphatase: 56 U/L (ref 39–117)
BUN: 16 mg/dL (ref 6–23)
CO2: 29 meq/L (ref 19–32)
Calcium: 8.8 mg/dL (ref 8.4–10.5)
Chloride: 110 meq/L (ref 96–112)
Creatinine, Ser: 0.87 mg/dL (ref 0.40–1.20)
GFR: 69.66 mL/min (ref >60.00)
Glucose, Bld: 75 mg/dL (ref 70–99)
Potassium: 4.1 meq/L (ref 3.5–5.1)
Sodium: 144 meq/L (ref 135–145)
Total Bilirubin: 0.4 mg/dL (ref 0.2–1.2)
Total Protein: 6.6 g/dL (ref 6.0–8.3)

## 2023-08-05 LAB — LIPID PANEL
Cholesterol: 204 mg/dL — ABNORMAL HIGH (ref 0–200)
HDL: 64.6 mg/dL (ref >39.00)
LDL Cholesterol: 121 mg/dL — ABNORMAL HIGH (ref 0–99)
NonHDL: 139.38
Total CHOL/HDL Ratio: 3
Triglycerides: 91 mg/dL (ref 0.0–149.0)
VLDL: 18.2 mg/dL (ref 0.0–40.0)

## 2023-08-05 NOTE — Patient Instructions (Addendum)
It was great to see you!  Please schedule Bone Density --- [facility] [address] -- please schedule bone density scan with our front desk on your way out  Please go to the lab for blood work.   Our office will call you with your results unless you have chosen to receive results via MyChart.  If your blood work is normal we will follow-up each year for physicals and as scheduled for chronic medical problems.  If anything is abnormal we will treat accordingly and get you in for a follow-up.  Take care,  Lelon Mast

## 2023-08-05 NOTE — Progress Notes (Signed)
Subjective:    Claudia Adkins is a 67 y.o. female and is here for a comprehensive physical exam.  HPI  Health Maintenance Due  Topic Date Due   DEXA SCAN  07/04/2022    Acute Concerns: None  Chronic Issues: HTN  Well-controlled on medications Continues to monitor closely as needed at work and has had no elevations  Insomnia Currently taking 25 mg trazodone as needed Medication works well and she only takes it on days where she absolutely  Health Maintenance: Immunizations --up-to-date Colonoscopy -- UTD, last done 09/19/20. Multiple polyps removed. Results otherwise normal. Repeat February 2027 Mammogram -- Last done 09/10/22. Results were normal.  PAP -- N/A s/p hysterectomy.  Bone Density -- Last done 07/04/20. Shows osteopenia.  Diet --overall healthy eater Exercise --exercises regularly  Sleep habits --overall stable with as needed trazodone Mood --no concerns  UTD with dentist? -Yes UTD with eye doctor? -Yes  Weight history: Wt Readings from Last 10 Encounters:  08/05/23 169 lb (76.7 kg)  06/13/23 166 lb 9.6 oz (75.6 kg)  05/07/23 164 lb 4 oz (74.5 kg)  12/24/22 238 lb 9.6 oz (108.2 kg)  12/21/22 165 lb 4 oz (75 kg)  09/27/22 164 lb (74.4 kg)  08/15/22 167 lb (75.8 kg)  08/02/22 162 lb 8 oz (73.7 kg)  11/29/21 165 lb 9.6 oz (75.1 kg)  07/18/21 166 lb 2 oz (75.4 kg)   Body mass index is 24.25 kg/m. No LMP recorded. Patient has had a hysterectomy.  Alcohol use:  reports current alcohol use.  Tobacco use:  Tobacco Use: Medium Risk (08/05/2023)   Patient History    Smoking Tobacco Use: Former    Smokeless Tobacco Use: Never    Passive Exposure: Not on file   Eligible for lung cancer screening?  No     08/05/2023    1:19 PM  Depression screen PHQ 2/9  Decreased Interest 0  Down, Depressed, Hopeless 0  PHQ - 2 Score 0     Other providers/specialists: Patient Care Team: Jarold Motto, [place] as PCP - General (Physician Assistant)     PMHx, SurgHx, SocialHx, Medications, and Allergies were reviewed in the Visit Navigator and updated as appropriate.   Past Medical History:  Diagnosis Date   Allergy    Anemia    Osteopenia      Past Surgical History:  Procedure Laterality Date   ABDOMINAL HYSTERECTOMY     BREAST BIOPSY Right    1999? benign   CESAREAN SECTION       Family History  Problem Relation Age of Onset   Hyperlipidemia Mother    Hypertension Mother    Dementia Mother    Diabetes Mother    Cirrhosis Father    Alcohol abuse Father    HIV Brother    Breast cancer Neg Hx    Colon cancer Neg Hx    Pancreatic cancer Neg Hx    Esophageal cancer Neg Hx    Colon polyps Neg Hx    Rectal cancer Neg Hx    Stomach cancer Neg Hx     Social History   Tobacco Use   Smoking status: Former    Current packs/day: 0.50    Average packs/day: 0.5 packs/day for 10.0 years (5.0 ttl pk-yrs)    Types: Cigarettes   Smokeless tobacco: Never   Tobacco comments:    quit 30 years ago  Vaping Use   Vaping status: Never Used  Substance Use Topics   Alcohol use: Yes  Comment: occ   Drug use: No    Review of Systems:   Review of Systems  Constitutional:  Negative for chills, fever, malaise/fatigue and weight loss.  HENT:  Negative for hearing loss, sinus pain and sore throat.   Respiratory:  Negative for cough and hemoptysis.   Cardiovascular:  Negative for chest pain, palpitations, leg swelling and PND.  Gastrointestinal:  Negative for abdominal pain, constipation, diarrhea, heartburn, nausea and vomiting.  Genitourinary:  Negative for dysuria, frequency and urgency.  Musculoskeletal:  Negative for back pain, myalgias and neck pain.  Skin:  Negative for itching and rash.  Neurological:  Negative for dizziness, tingling, seizures and headaches.  Endo/Heme/Allergies:  Negative for polydipsia.  Psychiatric/Behavioral:  Negative for depression. The patient is not nervous/anxious.     Objective:   BP  130/70 (BP Location: Left Arm, Patient Position: Sitting, Cuff Size: Normal)   Pulse 72   Temp (!) 96.9 F (36.1 C) (Temporal)   Ht 5\' 10"  (1.778 m)   Wt 169 lb (76.7 kg)   SpO2 98%   BMI 24.25 kg/m  Body mass index is 24.25 kg/m.   General Appearance:    Alert, cooperative, no distress, appears stated age  Head:    Normocephalic, without obvious abnormality, atraumatic  Eyes:    PERRL, conjunctiva/corneas clear, EOM's intact, fundi    benign, both eyes  Ears:    Normal TM's and external ear canals, both ears  Nose:   Nares normal, septum midline, mucosa normal, no drainage    or sinus tenderness  Throat:   Lips, mucosa, and tongue normal; teeth and gums normal  Neck:   Supple, symmetrical, trachea midline, no adenopathy;    thyroid:  no enlargement/tenderness/nodules; no carotid   bruit or JVD  Back:     Symmetric, no curvature, ROM normal, no CVA tenderness  Lungs:     Clear to auscultation bilaterally, respirations unlabored  Chest Wall:    No tenderness or deformity   Heart:    Regular rate and rhythm, S1 and S2 normal, no murmur, rub or gallop  Breast Exam:    Deferred   Abdomen:     Soft, non-tender, bowel sounds active all four quadrants,    no masses, no organomegaly  Genitalia:    Deferred   Extremities:   Extremities normal, atraumatic, no cyanosis or edema  Pulses:   2+ and symmetric all extremities  Skin:   Skin color, texture, turgor normal, no rashes or lesions  Lymph nodes:   Cervical, supraclavicular, and axillary nodes normal  Neurologic:   CNII-XII intact, normal strength, sensation and reflexes    throughout    Assessment/Plan:   Routine physical examination Today patient counseled on age appropriate routine health concerns for screening and prevention, each reviewed and up to date or declined. Immunizations reviewed and up to date or declined. Labs ordered and reviewed. Risk factors for depression reviewed and negative. Hearing function and visual acuity  are intact. ADLs screened and addressed as needed. Functional ability and level of safety reviewed and appropriate. Education, counseling and referrals performed based on assessed risks today. Patient provided with a copy of personalized plan for preventive services.  Primary hypertension Normotensive Continue to monitor and follow-up if blood pressure readings are consistently greater than 150/90  Hyperlipidemia, unspecified hyperlipidemia type Update lipid panel and provide recommendations accordingly  Osteopenia, unspecified location Encourage patient to update DEXA scan  Vitamin D deficiency Update vitamin D and provide recommendations accordingly  Primary insomnia  Continue as needed trazodone 25 mg daily Follow-up in 1 year, sooner if concerns  I, Isabelle Course, acting as a Neurosurgeon for Jarold Motto, [place]., have documented all relevant documentation on the behalf of Jarold Motto, [place], as directed by  Jarold Motto, PA while in the presence of Jarold Motto, [place].  I, Jarold Motto, [place], have reviewed all documentation for this visit. The documentation on 08/05/23 for the exam, diagnosis, procedures, and orders are all accurate and complete.   Jarold Motto, PA-C [facility]

## 2023-08-06 ENCOUNTER — Other Ambulatory Visit: Payer: Self-pay | Admitting: Physician Assistant

## 2023-08-06 MED ORDER — VITAMIN D (ERGOCALCIFEROL) 1.25 MG (50000 UNIT) PO CAPS: 50000.0000 [IU] | ORAL_CAPSULE | ORAL | 0 refills | Status: DC

## 2023-08-11 ENCOUNTER — Encounter: Payer: Self-pay | Admitting: Physician Assistant

## 2023-08-12 ENCOUNTER — Ambulatory Visit (INDEPENDENT_AMBULATORY_CARE_PROVIDER_SITE_OTHER)
Admission: RE | Admit: 2023-08-12 | Discharge: 2023-08-12 | Disposition: A | Discharge status: Home / Self Care | Payer: BC Managed Care – PPO | Source: Ambulatory Visit | Attending: Physician Assistant

## 2023-08-12 DIAGNOSIS — M8580 Other specified disorders of bone density and structure, unspecified site: Secondary | ICD-10-CM | POA: Diagnosis not present

## 2023-08-29 ENCOUNTER — Other Ambulatory Visit: Payer: Self-pay | Admitting: Physician Assistant

## 2023-08-29 DIAGNOSIS — Z1231 Encounter for screening mammogram for malignant neoplasm of breast: Secondary | ICD-10-CM

## 2023-09-12 ENCOUNTER — Ambulatory Visit
Admission: RE | Admit: 2023-09-12 | Discharge: 2023-09-12 | Disposition: A | Discharge status: Home / Self Care | Payer: 59 | Source: Ambulatory Visit | Attending: Physician Assistant | Admitting: Physician Assistant

## 2023-09-12 DIAGNOSIS — Z1231 Encounter for screening mammogram for malignant neoplasm of breast: Secondary | ICD-10-CM

## 2023-10-29 ENCOUNTER — Other Ambulatory Visit: Payer: Self-pay | Admitting: *Deleted

## 2023-10-29 ENCOUNTER — Other Ambulatory Visit: Payer: Self-pay | Admitting: Physician Assistant

## 2023-10-29 ENCOUNTER — Telehealth: Payer: Self-pay | Admitting: *Deleted

## 2023-10-29 DIAGNOSIS — E559 Vitamin D deficiency, unspecified: Secondary | ICD-10-CM

## 2023-10-29 NOTE — Telephone Encounter (Signed)
 Copied from CRM [id number]. Topic: Clinical - Prescription Issue >> Oct 29, 2023 12:07 PM [place] wrote: Reason for CRM: Patient wanting to know if she can get a refill on Vitamin D or is she needs to get another bone density test.. please reach out

## 2023-10-29 NOTE — Telephone Encounter (Signed)
 Spoke to pt told her she was only suppose to take the high dose Vit D for 12 weeks and then recheck Vit D level. Pt verbalized understanding and asked when can she come in? Appt scheduled for Thursday 3/19 at 9:30 for lab only. Pt verbalized understanding. Orders put in Epic.

## 2023-10-31 ENCOUNTER — Other Ambulatory Visit

## 2023-10-31 DIAGNOSIS — E559 Vitamin D deficiency, unspecified: Secondary | ICD-10-CM | POA: Diagnosis not present

## 2023-10-31 LAB — VITAMIN D 25 HYDROXY (VIT D DEFICIENCY, FRACTURES): VITD: 51.72 ng/mL (ref 30.00–100.00)

## 2023-11-01 ENCOUNTER — Encounter: Payer: Self-pay | Admitting: Physician Assistant

## 2023-12-30 ENCOUNTER — Encounter: Payer: Self-pay | Admitting: Family

## 2023-12-30 ENCOUNTER — Ambulatory Visit: Admitting: Family

## 2023-12-30 ENCOUNTER — Ambulatory Visit: Payer: Self-pay

## 2023-12-30 VITALS — BP 144/77 | HR 72 | Temp 97.5°F | Wt 173.2 lb

## 2023-12-30 DIAGNOSIS — L03116 Cellulitis of left lower limb: Secondary | ICD-10-CM | POA: Diagnosis not present

## 2023-12-30 DIAGNOSIS — W57XXXA Bitten or stung by nonvenomous insect and other nonvenomous arthropods, initial encounter: Secondary | ICD-10-CM

## 2023-12-30 DIAGNOSIS — S70362A Insect bite (nonvenomous), left thigh, initial encounter: Secondary | ICD-10-CM

## 2023-12-30 MED ORDER — TRIAMCINOLONE ACETONIDE 0.1 % EX CREA: 1.0000 | TOPICAL_CREAM | Freq: Two times a day (BID) | CUTANEOUS | 0 refills | Status: DC

## 2023-12-30 MED ORDER — DOXYCYCLINE HYCLATE 100 MG PO TABS: 100.0000 mg | ORAL_TABLET | Freq: Two times a day (BID) | ORAL | 0 refills | Status: AC

## 2023-12-30 NOTE — Progress Notes (Signed)
 Patient ID: Claudia Adkins, female    DOB: [birth year], 68 y.o.   MRN: [id number]  Chief Complaint  Patient presents with   Insect Bite    Pt c/o insect bite on inner left thigh and back of right knee. Present for 1 week. Has tried perxide. Pt c/o redness and warm to touch.   Discussed the use of AI scribe software for clinical note transcription with the patient, who gave verbal consent to proceed.  History of Present Illness Claudia Adkins is a 68 year old female who presents with severe itching and swelling of the leg following a bug bite.  She experienced severe itching and swelling of her leg after an insect bite while gardening last week. She applied peroxide to the area, but her leg became swollen, red, and inflamed. The itching is intense and affects her whole leg. She has not removed any ticks and does not believe it was a tick bite.  Her scalp is also itching, though not entirely, just on the top and towards right side, and she has noticed hair breakage. She uses Head & Shoulders shampoo and is unsure of the cause of the irritation. She has no known skin issues and has not recently consulted a dermatologist.  Assessment & Plan Cellulitis of the leg - Mild cellulitis likely from insect bite during gardening. Upper inner left thigh, pinpoint sore with dried drop of serosanguinous drg, surrounded w/erythema, slightly warm to touch, mild swelling in entire thigh    - Prescribe doxycycline  for one week. Advised taking with food and using sunscreen when outside and/or cover up skin. - Prescribed triamcinolone  cream for itching. Advised mixing with body cream to avoid skin lightening. - Recommended wearing protective clothing and using bug repellent during gardening. - Instructed to contact clinic if no improvement in sx by end of week.  Scalp itching/hair breakage -  No noticeable erythema, dry skin, or excoriation on area of concern. Advised on moisture. Ok to apply above  triamcinolone  w/body cream mix and see if any improvement in itching. - Call office if sx are not improving or worsen, can send Dermatology referral.  Subjective:     Outpatient Medications Prior to Visit  Medication Sig Dispense Refill   ibuprofen  (ADVIL ) 200 MG tablet Take 200 mg by mouth every 6 (six) hours as needed.     Multiple Vitamin (MULTIVITAMIN) tablet Take 1 tablet by mouth daily.     traZODone  (DESYREL ) 25 mg TABS tablet Take 25 mg by mouth at bedtime as needed for sleep.     Vitamin D , Ergocalciferol , (DRISDOL ) 1.25 MG (50000 UNIT) CAPS capsule Take 1 capsule (50,000 Units total) by mouth every 7 (seven) days. (Patient not taking: Reported on 12/30/2023) 12 capsule 0   No facility-administered medications prior to visit.   Past Medical History:  Diagnosis Date   Allergy    Anemia    Osteopenia    Past Surgical History:  Procedure Laterality Date   ABDOMINAL HYSTERECTOMY     BREAST BIOPSY Right    1999? benign   CESAREAN SECTION     Allergies  Allergen Reactions   Sulfa Antibiotics Other (See Comments)    Bad body aches   Sulfamethoxazole-Trimethoprim Rash      Objective:    Physical Exam Vitals and nursing note reviewed.  Constitutional:      Appearance: Normal appearance.  Cardiovascular:     Rate and Rhythm: Normal rate and regular rhythm.  Pulmonary:  Effort: Pulmonary effort is normal.     Breath sounds: Normal breath sounds.  Musculoskeletal:        General: Normal range of motion.  Skin:    General: Skin is warm and dry.     Findings: Lesion (upper inner left thigh, pinpoint sore with dried drop of serosanguinous drg, surrounded w/erythema, slightly warm to touch, mild swelling in entire thigh) present.  Neurological:     Mental Status: She is alert.  Psychiatric:        Mood and Affect: Mood normal.        Behavior: Behavior normal.    BP (!) 144/77 (BP Location: Left Arm, Patient Position: Sitting, Cuff Size: Large)   Pulse 72   Temp  (!) 97.5 F (36.4 C) (Temporal)   Wt 173 lb 3.2 oz (78.6 kg)   BMI 24.85 kg/m  Wt Readings from Last 3 Encounters:  12/30/23 173 lb 3.2 oz (78.6 kg)  08/05/23 169 lb (76.7 kg)  06/13/23 166 lb 9.6 oz (75.6 kg)      Versa Gore, NP

## 2023-12-30 NOTE — Telephone Encounter (Signed)
 Appt today

## 2023-12-30 NOTE — Telephone Encounter (Signed)
  Chief Complaint: Bug bites - now swollen red, painful and hot to touch Symptoms: above Frequency: last week Pertinent Negatives: Patient denies fever Disposition: [] ED /[] Urgent Care (no appt availability in office) / [x] Appointment(In office/virtual)/ []  [facility]/ [] Home Care/ [] Refused Recommended Disposition /[] [facility]/ []  Follow-up with PCP Additional Notes: Pt states that she was bitten by bugs last week and now those bug bites are red, swollen , painful and hot to touch. Appt scheduled for this afternoon.    Copied from CRM [id number]. Topic: Clinical - Red Word Triage >> Dec 30, 2023 10:05 AM Claudia Adkins J wrote: Red Word that prompted transfer to Nurse Triage: Bug bite from outside, on left thigh and right leg behind knee. Pt states the bites are swollen itching and have fevers. Reason for Disposition  [1] Red or very tender (to touch) area AND [2] started over 24 hours after the bite  Answer Assessment - Initial Assessment Questions 1. TYPE of INSECT: "What type of insect was it?"      unknown 2. ONSET: "When did you get bitten?"      Last week 3. LOCATION: "Where is the insect bite located?"      Left thigh, right leg behind knee 4. REDNESS: "Is the area red or pink?" If Yes, ask: "What size is area of redness?" (inches or cm). "When did the redness start?"     yes 5. PAIN: "Is there any pain?" If Yes, ask: "How bad is it?"  (Scale 1-10; or mild, moderate, severe)     yes 6. ITCHING: "Does it itch?" If Yes, ask: "How bad is the itch?"    - MILD: doesn't interfere with normal activities   - MODERATE-SEVERE: interferes with work, school, sleep, or other activities      no 7. SWELLING: "How big is the swelling?" (inches, cm, or compare to coins)     yes 8. OTHER SYMPTOMS: "Do you have any other symptoms?"  (e.g., difficulty breathing, hives)     Redness, swelling hot to touch  Protocols used: Insect Bite-A-AH

## 2024-02-05 ENCOUNTER — Ambulatory Visit (INDEPENDENT_AMBULATORY_CARE_PROVIDER_SITE_OTHER): Admitting: Physician Assistant

## 2024-02-05 ENCOUNTER — Encounter: Payer: Self-pay | Admitting: Physician Assistant

## 2024-02-05 ENCOUNTER — Ambulatory Visit: Payer: Self-pay | Admitting: *Deleted

## 2024-02-05 VITALS — BP 128/80 | HR 70 | Temp 97.7°F | Ht 70.0 in | Wt 172.0 lb

## 2024-02-05 DIAGNOSIS — H1031 Unspecified acute conjunctivitis, right eye: Secondary | ICD-10-CM | POA: Diagnosis not present

## 2024-02-05 MED ORDER — AMOXICILLIN-POT CLAVULANATE 875-125 MG PO TABS: 1.0000 | ORAL_TABLET | Freq: Two times a day (BID) | ORAL | 0 refills | Status: DC

## 2024-02-05 MED ORDER — POLYMYXIN B-TRIMETHOPRIM 10000-0.1 UNIT/ML-% OP SOLN: 1.0000 [drp] | OPHTHALMIC | 0 refills | Status: AC

## 2024-02-05 NOTE — Telephone Encounter (Signed)
 Copied from CRM [id number]. Topic: Clinical - Red Word Triage >> Feb 05, 2024  9:43 AM Marissa P wrote: Red Word that prompted transfer to Nurse Triage: Patient called right eye is red, irritated and soreness. No swelling. Started yesterday. Would like to be seen as soon as possible please Reason for Disposition  Eye pain present > 24 hours  Answer Assessment - Initial Assessment Questions 1. ONSET: When did the pain start? (e.g., minutes, hours, days)     Right eye is red, soreness,  or irritated  I woke up yesterday with it. No draining.   It's red and irritated.   No itching or burning.   It's soreness.    2. TIMING: Does the pain come and go, or has it been constant since it started? (e.g., constant, intermittent, fleeting)     Since yesterday morning 3. SEVERITY: How bad is the pain?   (Scale 1-10; mild, moderate or severe)   - MILD (1-3): doesn't interfere with normal activities    - MODERATE (4-7): interferes with normal activities or awakens from sleep    - SEVERE (8-10): excruciating pain and patient unable to do normal activities     Moderate 4. LOCATION: Where does it hurt?  (e.g., eyelid, eye, cheekbone)     Yes it's irritated 5. CAUSE: What do you think is causing the pain?     I don't know 6. VISION: Do you have blurred vision or changes in your vision?      No 7. EYE DISCHARGE: Is there any discharge (pus) from the eye(s)?  If Yes, ask: What color is it?      No 8. FEVER: Do you have a fever? If Yes, ask: What is it, how was it measured, and when did it start?      Not asked 9. OTHER SYMPTOMS: Do you have any other symptoms? (e.g., headache, nasal discharge, facial rash)     No drainage from eye.   No headaches.  No dizziness 10. PREGNANCY: Is there any chance you are pregnant? When was your last menstrual period?       N/A due to age  Protocols used: Eye Pain and Other Symptoms-A-AH FYI Only or Action Required?: FYI only for provider.  Patient  was last seen in primary care on 12/30/2023 by Lucius Krabbe, NP. Called Nurse Triage reporting Eye Pain. Symptoms began yesterday. Interventions attempted: Nothing. Symptoms are: gradually worsening.  Triage Disposition: See Physician Within 24 Hours  Patient/caregiver understands and will follow disposition?: Yes

## 2024-02-05 NOTE — Progress Notes (Signed)
 Claudia Adkins is a 68 y.o. female here for a new problem.  History of Present Illness:   Chief Complaint  Patient presents with   Eye Problem    Pt c/o right eye is red, no itching or drainage.    HPI  Eye problem Pt complains of redness in her right eye starting yesterday.  When she blinks, she reports feeling pressure in her right eye. Endorses using OTC eye drops, eye redness relief -- per pt, her eye doctor advised pt to stop using these. Endorses getting new glasses recently. Denies itchiness, drainage, burning, blurred vision, nasal congestion, or cough. Denies wearing contacts.    Past Medical History:  Diagnosis Date   Allergy    Anemia    Osteopenia      Social History   Tobacco Use   Smoking status: Former    Current packs/day: 0.50    Average packs/day: 0.5 packs/day for 10.0 years (5.0 ttl pk-yrs)    Types: Cigarettes   Smokeless tobacco: Never   Tobacco comments:    quit 30 years ago  Vaping Use   Vaping status: Never Used  Substance Use Topics   Alcohol use: Yes    Comment: occ   Drug use: No    Past Surgical History:  Procedure Laterality Date   ABDOMINAL HYSTERECTOMY     BREAST BIOPSY Right    1999? benign   CESAREAN SECTION      Family History  Problem Relation Age of Onset   Hyperlipidemia Mother    Hypertension Mother    Dementia Mother    Diabetes Mother    Cirrhosis Father    Alcohol abuse Father    HIV Brother    Breast cancer Neg Hx    Colon cancer Neg Hx    Pancreatic cancer Neg Hx    Esophageal cancer Neg Hx    Colon polyps Neg Hx    Rectal cancer Neg Hx    Stomach cancer Neg Hx     Allergies  Allergen Reactions   Sulfa Antibiotics Other (See Comments)    Bad body aches   Sulfamethoxazole-Trimethoprim Rash    Current Medications:   Current Outpatient Medications:    amoxicillin -clavulanate (AUGMENTIN ) 875-125 MG tablet, Take 1 tablet by mouth 2 (two) times daily., Disp: 14 tablet, Rfl: 0   ibuprofen   (ADVIL ) 200 MG tablet, Take 200 mg by mouth every 6 (six) hours as needed., Disp: , Rfl:    Multiple Vitamin (MULTIVITAMIN) tablet, Take 1 tablet by mouth daily., Disp: , Rfl:    traZODone  (DESYREL ) 25 mg TABS tablet, Take 25 mg by mouth at bedtime as needed for sleep., Disp: , Rfl:    triamcinolone  cream (KENALOG ) 0.1 %, Apply 1 Application topically 2 (two) times daily. Left leg itching and swelling, mix equal parts (small drop) with body cream., Disp: 30 g, Rfl: 0   trimethoprim-polymyxin b (POLYTRIM) ophthalmic solution, Place 1 drop into the right eye every 4 (four) hours., Disp: 10 mL, Rfl: 0   Review of Systems:   Negative unless otherwise specified per HPI.  Vitals:   Vitals:   02/05/24 1350  BP: 128/80  Pulse: 70  Temp: 97.7 F (36.5 C)  TempSrc: Temporal  SpO2: 98%  Weight: 172 lb (78 kg)  Height: 5' 10 (1.778 m)     Body mass index is 24.68 kg/m.  Physical Exam:   Physical Exam Vitals and nursing note reviewed.  Constitutional:      General: She is  not in acute distress.    Appearance: She is well-developed. She is not ill-appearing or toxic-appearing.  HENT:     Head: Normocephalic and atraumatic.     Right Ear: Tympanic membrane, ear canal and external ear normal. Tympanic membrane is not erythematous, retracted or bulging.     Left Ear: Tympanic membrane, ear canal and external ear normal. Tympanic membrane is not erythematous, retracted or bulging.     Nose: Nose normal.     Right Sinus: No maxillary sinus tenderness or frontal sinus tenderness.     Left Sinus: No maxillary sinus tenderness or frontal sinus tenderness.     Mouth/Throat:     Pharynx: Uvula midline. No posterior oropharyngeal erythema.   Eyes:     General: Lids are normal.     Conjunctiva/sclera:     Right eye: Right conjunctiva is injected.   Neck:     Trachea: Trachea normal.   Cardiovascular:     Rate and Rhythm: Normal rate and regular rhythm.     Heart sounds: Normal heart  sounds, S1 normal and S2 normal.  Pulmonary:     Effort: Pulmonary effort is normal.     Breath sounds: Normal breath sounds. No decreased breath sounds, wheezing, rhonchi or rales.  Lymphadenopathy:     Cervical: No cervical adenopathy.   Skin:    General: Skin is warm and dry.   Neurological:     Mental Status: She is alert.   Psychiatric:        Speech: Speech normal.        Behavior: Behavior normal. Behavior is cooperative.     Assessment and Plan:   1. Acute conjunctivitis of right eye, unspecified acute conjunctivitis type (Primary)  No red flags on discussion, patient is not in any obvious distress during our visit. Provided patient with Polytrim for suspected conjunctivitis  Due to her upcoming travel, I did however provide pocket rx for oral amoxicillin  should symptoms not improve as anticipated. Discussed over the counter supportive care options, with recommendations to push fluids and rest. Reviewed return precautions including new/worsening fever, SOB, new/worsening cough or other concerns.  Recommended need to self-quarantine and practice social distancing until symptoms resolve. I recommend that patient follow-up if symptoms worsen or persist despite treatment x 7-10 days, sooner if needed.   I, Lavern Simmers, acting as a Neurosurgeon for [employer], [place]., have documented all relevant documentation on the behalf of Lucie Buttner, [place], as directed by Lucie Buttner, PA while in the presence of Lucie Buttner, [place].  I, Lucie Buttner, [place], have reviewed all documentation for this visit. The documentation on 02/05/24 for the exam, diagnosis, procedures, and orders are all accurate and complete.  Lucie Buttner, PA-C

## 2024-02-05 NOTE — Telephone Encounter (Signed)
 Appt today

## 2024-02-17 ENCOUNTER — Ambulatory Visit (INDEPENDENT_AMBULATORY_CARE_PROVIDER_SITE_OTHER): Admitting: Physician Assistant

## 2024-02-17 VITALS — BP 122/72 | HR 72 | Temp 98.1°F | Ht 70.0 in | Wt 172.0 lb

## 2024-02-17 DIAGNOSIS — H5789 Other specified disorders of eye and adnexa: Secondary | ICD-10-CM

## 2024-02-17 DIAGNOSIS — H5711 Ocular pain, right eye: Secondary | ICD-10-CM

## 2024-02-17 NOTE — Patient Instructions (Addendum)
 [facility] Appointment time at 11:30 am, arrive at 11:15 am  Address: [address] [place], [place], [place] [address] Phone: [phone]     VISIT SUMMARY: You visited us  today due to persistent redness and irritation in your right eye, which has been ongoing for over a week. We discussed your symptoms, previous treatments, and planned the next steps for further evaluation.  YOUR PLAN: RIGHT EYE REDNESS AND IRRITATION: You have been experiencing redness and irritation in your right eye, with a gritty sensation when blinking. There is no discharge, and your vision remains clear. -You are referred to [facility] for an ophthalmology evaluation at 11:30 AM tomorrow. -Use warm compresses on your eye tonight. -Perform eyelid scrubs to maintain eye hygiene. -Additional blood work may be considered if recommended by the ophthalmologist.                     Contains text generated by [employer]bridge.                                 Contains text generated by [employer]bridge.

## 2024-02-17 NOTE — Progress Notes (Signed)
 Patient ID: Claudia Adkins, female    DOB: 1955-10-28, 68 y.o.   MRN: [id number]   Assessment & Plan:  Acute pain in right eye  Redness of right eye      Assessment and Plan Assessment & Plan Right Eye Redness and Irritation Persistent redness and irritation in the right eye for over a week, with a bloodshot appearance and gritty sensation upon blinking. No discharge, no history of eye issues, and she does not wear contact lenses. Visual acuity is 20/25 in the right eye. Fluorescein stain showed no corneal ulcer, abrasion, or foreign body. Differential diagnosis includes uveitis due to lack of improvement with Polytrim  eye drops. Augmentin  was previously prescribed but not tolerated. Referral to ophthalmology is planned for further evaluation. - Refer to [facility] for ophthalmology evaluation at 11:30 AM tomorrow. - Advise warm compresses on the eye tonight. - Recommend eyelid scrubs to maintain eye hygiene. - Ok to use Refresh PF eye drops for comfort - Consider additional blood work if recommended by the ophthalmologist.  General Health Maintenance Maintains an active lifestyle with blood pressure within normal limits.      No follow-ups on file.    Subjective:    Chief Complaint  Patient presents with   Eye Problem    Pt seen today for right eye trouble, eye is red and feels like it has grit in it. Was seen previously for this but it is not getting any better. Took the polytrim  eye drops for two days and she said they did not work so she stopped taking them.    Eye Problem    Discussed the use of AI scribe software for clinical note transcription with the patient, who gave verbal consent to proceed.  History of Present Illness Claudia Adkins is a 68 year old female who presents with persistent redness and irritation in the right eye.  She began experiencing soreness around the muscle of her right eye and a bloodshot appearance last week before her  vacation. There was no drainage or crusting noted. She was concerned about her ability to go on vacation due to the eye's appearance.  She was prescribed Polytrim  eye drops and Augmentin , but she did not complete the course of Augmentin  due to gastrointestinal discomfort after taking only two pills. The eye drops have not provided relief, and she continues to experience a gritty sensation in the eye, especially when blinking.  During her vacation in [place] , she attempted to rinse the eye with warm water but did not use any other medications. She denies any history of eye issues, does not wear contact lenses, and recently had her glasses prescription renewed.  No changes in vision, such as blurriness or double vision, and no discharge or matting of the eye in the morning. She has not experienced headaches or dizziness. She maintains an active lifestyle and drives herself to appointments.     Past Medical History:  Diagnosis Date   Allergy    Anemia    Osteopenia     Past Surgical History:  Procedure Laterality Date   ABDOMINAL HYSTERECTOMY     BREAST BIOPSY Right    1999? benign   CESAREAN SECTION      Family History  Problem Relation Age of Onset   Hyperlipidemia Mother    Hypertension Mother    Dementia Mother    Diabetes Mother    Cirrhosis Father    Alcohol abuse Father    HIV Brother  Breast cancer Neg Hx    Colon cancer Neg Hx    Pancreatic cancer Neg Hx    Esophageal cancer Neg Hx    Colon polyps Neg Hx    Rectal cancer Neg Hx    Stomach cancer Neg Hx     Social History   Tobacco Use   Smoking status: Former    Current packs/day: 0.50    Average packs/day: 0.5 packs/day for 10.0 years (5.0 ttl pk-yrs)    Types: Cigarettes   Smokeless tobacco: Never   Tobacco comments:    quit 30 years ago  Vaping Use   Vaping status: Never Used  Substance Use Topics   Alcohol use: Yes    Comment: occ   Drug use: No     Allergies  Allergen Reactions   Sulfa  Antibiotics Other (See Comments)    Bad body aches   Sulfamethoxazole-Trimethoprim  Rash    Review of Systems NEGATIVE UNLESS OTHERWISE INDICATED IN HPI      Objective:     BP 122/72 (BP Location: Right Arm, Patient Position: Sitting, Cuff Size: Normal)   Pulse 72   Temp 98.1 F (36.7 C) (Temporal)   Ht 5' 10 (1.778 m)   Wt 172 lb (78 kg)   SpO2 96%   BMI 24.68 kg/m   Wt Readings from Last 3 Encounters:  02/17/24 172 lb (78 kg)  02/05/24 172 lb (78 kg)  12/30/23 173 lb 3.2 oz (78.6 kg)    BP Readings from Last 3 Encounters:  02/17/24 122/72  02/05/24 128/80  12/30/23 (!) 144/77     Physical Exam Vitals and nursing note reviewed.  Constitutional:      Appearance: Normal appearance.  Eyes:     General: Lids are normal. Lids are everted, no foreign bodies appreciated. No allergic shiner, visual field deficit or scleral icterus.       Right eye: No foreign body, discharge or hordeolum.     Extraocular Movements: Extraocular movements intact.     Right eye: Normal extraocular motion.     Left eye: Normal extraocular motion.     Conjunctiva/sclera:     Right eye: Right conjunctiva is injected (pinguecula noted).     Left eye: Left conjunctiva is not injected.     Pupils: Pupils are equal, round, and reactive to light.     Comments: Eye Exam: Eyelid everted and swept for foreign body. The eye was anesthetized with 2 drops of tetracaine ophth drops and stained with fluorescein. Examination under woods lamp does not reveal a foreign body or area of increased stain uptake.   Neurological:     Mental Status: She is alert.             Selenne Coggin M Gia Lusher, PA-C

## 2024-03-04 ENCOUNTER — Encounter: Payer: Self-pay | Admitting: Physician Assistant

## 2024-08-10 ENCOUNTER — Encounter: Payer: BC Managed Care – PPO | Admitting: Physician Assistant

## 2024-08-18 ENCOUNTER — Encounter: Admitting: Physician Assistant

## 2024-09-01 ENCOUNTER — Other Ambulatory Visit: Payer: Self-pay | Admitting: Physician Assistant

## 2024-09-01 DIAGNOSIS — Z1231 Encounter for screening mammogram for malignant neoplasm of breast: Secondary | ICD-10-CM

## 2024-09-14 ENCOUNTER — Ambulatory Visit

## 2024-09-17 ENCOUNTER — Ambulatory Visit
Admission: RE | Admit: 2024-09-17 | Discharge: 2024-09-17 | Disposition: A | Source: Ambulatory Visit | Attending: Physician Assistant

## 2024-09-17 DIAGNOSIS — Z1231 Encounter for screening mammogram for malignant neoplasm of breast: Secondary | ICD-10-CM

## 2024-10-07 ENCOUNTER — Encounter: Admitting: Physician Assistant
